# Patient Record
Sex: Male | Born: 1985 | Race: White | Hispanic: No | Marital: Single | Smoking: Heavy tobacco smoker
Health system: Southern US, Community
[De-identification: ages and names within clinical notes are randomized; demographics above are authoritative.]

## PROBLEM LIST (undated history)

## (undated) DIAGNOSIS — F17200 Nicotine dependence, unspecified, uncomplicated: Secondary | ICD-10-CM

## (undated) DIAGNOSIS — S72301A Unspecified fracture of shaft of right femur, initial encounter for closed fracture: Secondary | ICD-10-CM

## (undated) DIAGNOSIS — F319 Bipolar disorder, unspecified: Secondary | ICD-10-CM

## (undated) DIAGNOSIS — S46211A Strain of muscle, fascia and tendon of other parts of biceps, right arm, initial encounter: Secondary | ICD-10-CM

## (undated) DIAGNOSIS — F410 Panic disorder [episodic paroxysmal anxiety] without agoraphobia: Secondary | ICD-10-CM

## (undated) DIAGNOSIS — S82001B Unspecified fracture of right patella, initial encounter for open fracture type I or II: Secondary | ICD-10-CM

## (undated) HISTORY — PX: UPPER GASTROINTESTINAL ENDOSCOPY: SHX188

---

## 2015-06-17 ENCOUNTER — Emergency Department (HOSPITAL_COMMUNITY): Payer: 59

## 2015-06-17 ENCOUNTER — Emergency Department (HOSPITAL_COMMUNITY)
Admission: EM | Admit: 2015-06-17 | Discharge: 2015-06-18 | Disposition: A | Discharge status: Home / Self Care | Payer: 59 | Attending: Emergency Medicine | Admitting: Emergency Medicine

## 2015-06-17 ENCOUNTER — Encounter (HOSPITAL_COMMUNITY): Payer: Self-pay | Admitting: Emergency Medicine

## 2015-06-17 DIAGNOSIS — F17200 Nicotine dependence, unspecified, uncomplicated: Secondary | ICD-10-CM | POA: Insufficient documentation

## 2015-06-17 DIAGNOSIS — M542 Cervicalgia: Secondary | ICD-10-CM | POA: Insufficient documentation

## 2015-06-17 DIAGNOSIS — G43109 Migraine with aura, not intractable, without status migrainosus: Secondary | ICD-10-CM | POA: Insufficient documentation

## 2015-06-17 DIAGNOSIS — R51 Headache: Secondary | ICD-10-CM | POA: Diagnosis present

## 2015-06-17 DIAGNOSIS — R519 Headache, unspecified: Secondary | ICD-10-CM

## 2015-06-17 DIAGNOSIS — M545 Low back pain: Secondary | ICD-10-CM | POA: Diagnosis not present

## 2015-06-17 DIAGNOSIS — Z8659 Personal history of other mental and behavioral disorders: Secondary | ICD-10-CM | POA: Diagnosis not present

## 2015-06-17 HISTORY — DX: Bipolar disorder, unspecified: F31.9

## 2015-06-17 MED ORDER — METOCLOPRAMIDE HCL 5 MG/ML IJ SOLN
10.0000 mg | Freq: Once | INTRAMUSCULAR | Status: AC
  Administered 2015-06-18: 10 mg via INTRAVENOUS
  Filled 2015-06-17: qty 2

## 2015-06-17 MED ORDER — KETOROLAC TROMETHAMINE 15 MG/ML IJ SOLN
15.0000 mg | Freq: Once | INTRAMUSCULAR | Status: AC
  Administered 2015-06-18: 15 mg via INTRAVENOUS
  Filled 2015-06-17: qty 1

## 2015-06-17 MED ORDER — SODIUM CHLORIDE 0.9 % IV BOLUS (SEPSIS)
1000.0000 mL | Freq: Once | INTRAVENOUS | Status: AC
  Administered 2015-06-18: 1000 mL via INTRAVENOUS

## 2015-06-17 NOTE — ED Notes (Signed)
Pt c/o headache and back pain onset x 3 days, fever at home, no hx of migraines or chronic pain. Denies n/v/d

## 2015-06-17 NOTE — ED Provider Notes (Signed)
CSN: [id number]     Arrival date & time 06/17/15  2125 History  By signing my name below, I, Marisue Humble, attest that this documentation has been prepared under the direction and in the presence of Derwood Kaplan, MD . Electronically Signed: Marisue Humble, Scribe. 06/18/2015. 1:25 AM.   Chief Complaint  Patient presents with  . Headache   The history is provided by the patient. No language interpreter was used.   HPI Comments:  Joel Hansen is a 30 y.o. male with PMHx of bipolar 1 disorder who presents to the Emergency Department complaining of worsening 10/10 lower back pain, headache, and pain opening both eyes onset a few days ago, worsening today. Pain began as mild and has now progressed to the point he can't get up. All symptoms started at the same time; he has never experienced similar pain. Pt reports associated fatigue, neck discomfort, nausea, teary eyes, fever tmax 100 at home, chills onset in ED, and body aches. He has taken 7 Ibuprofen and Aleve without relief. Pt notes he does a lot of heavy lifting and traveling every day for work, but he denies h/o back injury. Pt reports sick contact with the flu. He reports his mother has h/o migraine headaches and his grandmother had a brain aneurism OR a brain tumor. Denies rhinorrhea, h/o of IV drug use, diarrhea, bloody stools, incontinence, new vision changes, numbness, tingling, rashes or tick bites.  Past Medical History  Diagnosis Date  . Bipolar 1 disorder (HCC)    History reviewed. No pertinent past surgical history. No family history on file. Social History  Substance Use Topics  . Smoking status: Current Every Day Smoker  . Smokeless tobacco: None  . Alcohol Use: No    Review of Systems  10 systems reviewed and all are negative for acute change except as noted in the HPI.  Allergies  Lexapro  Home Medications   Prior to Admission medications   Medication Sig Start Date End Date Taking? Authorizing Provider   naproxen (NAPROSYN) 500 MG tablet Take 1 tablet (500 mg total) by mouth 2 (two) times daily. 06/18/15   Chara Marquard, MD   BP 102/63 mmHg  Pulse 58  Temp(Src) 98.8 F (37.1 C) (Oral)  Resp 16  Ht  (1.854 m)  Wt 182 lb 6.4 oz (82.736 kg)  BMI 24.07 kg/m2  SpO2 97% Physical Exam  Constitutional: He is oriented to person, place, and time. He appears well-developed and well-nourished.  HENT:  Head: Normocephalic and atraumatic.  Eyes: EOM are normal.  No nystagmus seen; pupils 5mm equal reactive to light  Neck: Normal range of motion.  No meningismus  Cardiovascular: Normal rate, regular rhythm, normal heart sounds and intact distal pulses.   Pulmonary/Chest: Effort normal and breath sounds normal. No respiratory distress. He has no wheezes. He has no rales.  Lungs clear to auscultation   Abdominal: Soft. He exhibits no distension. There is no tenderness.  Musculoskeletal: Normal range of motion.  No midline L spine tenderness; no c-spine tenderness; pt has focal lower t-spine tenderness to palpation; no step offs, no ecchymoses; pt has left sided tenderness over the pelvic rim  Neurological: He is alert and oriented to person, place, and time. No cranial nerve deficit.  Cranial nerves 1-12 intact Upper extremity sensory exam normal and equal BL Cerebellar exam reveals no dysmetria  Upper and lower extremity strength 4+ and equal BL Trace patellar reflex BL, no hyperreflexia    Skin: Skin is warm and  dry.  Psychiatric: He has a normal mood and affect. Judgment normal.  Nursing note and vitals reviewed.   ED Course  Procedures  DIAGNOSTIC STUDIES:  Oxygen Saturation is 100% on RA, normal by my interpretation.    COORDINATION OF CARE:  11:24 PM Will administer fluids, Toradol, and Reglan. Will order imaging of thoracic spine, CBC, BMP, sedimentation rate and C-reactive protein. Discussed treatment plan with pt at bedside and pt agreed to plan.  1:05 AM Re-evaluated pt.  He notes medication has decreased his headache from a 10/10 to a 6/10. Photophobia and back pain are resolved. Still no focal neurologic complaints. Informed pt of imaging and lab results. Will administer another round of medication and re-evaluate. PT aware that the next step would be to get LP and r/o infection and bleed.  3:16 AM Headache and back pain are completely resolved. Pt advised to follow up with neurology since he has some migraine type features with his headache. Strict ER return precautions discussed, pt will come back to ER if there is any confusion, fevers, seizures, neck stiffness, or any other focal neurologic complaints.   Labs Review Labs Reviewed  CBC WITH DIFFERENTIAL/PLATELET - Abnormal; Notable for the following:    WBC 13.8 (*)    Neutro Abs 10.6 (*)    All other components within normal limits  BASIC METABOLIC PANEL  SEDIMENTATION RATE  C-REACTIVE PROTEIN    Imaging Review Dg Thoracic Spine 2 View  06/18/2015  CLINICAL DATA:  Mid and lower back pain since this morning. No known injury. EXAM: THORACIC SPINE 2 VIEWS COMPARISON:  None. FINDINGS: There is no evidence of thoracic spine fracture. Alignment is normal. No other significant bone abnormalities are identified. IMPRESSION: Negative. Electronically Signed   By: Marnee SpringJonathon  Watts M.D.   On: 06/18/2015 00:11   Dg Lumbar Spine Complete  06/18/2015  CLINICAL DATA:  Initial evaluation for acute severe mid and lower back pain. No injury. EXAM: LUMBAR SPINE - COMPLETE 4+ VIEW COMPARISON:  None. FINDINGS: Five non rib-bearing lumbar type vertebral bodies present. Vertebral bodies normally aligned with preservation of the normal lumbar lordosis paravertebral body heights well preserved. No acute or chronic fracture. Dermal degenerative endplate spurring anteriorly at the superior endplates of L3 no No other significant degenerative changes. SI joints approximated and symmetric. No soft tissue abnormality. IMPRESSION: 1. No  acute abnormality identified. 2. Minimal degenerative endplate spurring at the superior endplates of L3 and L4. No other significant degenerative changes identified. Electronically Signed   By: Rise MuBenjamin  McClintock M.D.   On: 06/18/2015 00:34   I have personally reviewed and evaluated these images and lab results as part of my medical decision-making.   EKG Interpretation None      MDM   Final diagnoses:  Acute nonintractable headache, unspecified headache type  Migraine equivalent    I personally performed the services described in this documentation, which was scribed in my presence. The recorded information has been reviewed and is accurate.  Pt comes in with cc of headache and back pain. Headache has some migraine like features - nausea, photophobia, throbbing pain. However, there is no hx headache for the patient. He does report that he has hx of brain tumor vs AN in the family and also migraine.  Clinical exam reveals no signs of elevated ICP as patient is laying flat and has no visual complains. He also doesn't have any meningismus or fevers. Pt has no focal neurologic deficit. Eye exam is normal - and pt  denies any new vision loss (baseline vision problems only).  Appears more like migraine type pain. We will start meds, and labs. We will get LP if patient is not improved or developed a fever.  Pt also has point tenderness on the spine - lower T spine mostly. With that, pt denies numbness, weakness, urinary incontinence, urinary retention, bowel incontinence, weakness, saddle anesthesia. We are not concerned for spinal cord compression. We will still get sed rate to screen for any deep infection and get Xrays.      Derwood Kaplan, MD 06/18/15 [phone]

## 2015-06-18 LAB — CBC WITH DIFFERENTIAL/PLATELET
Basophils Absolute: 0.1 10*3/uL (ref 0.0–0.1)
Basophils Relative: 0 %
EOS ABS: 0.1 10*3/uL (ref 0.0–0.7)
EOS PCT: 1 %
HCT: 40.4 % (ref 39.0–52.0)
HEMOGLOBIN: 13.9 g/dL (ref 13.0–17.0)
LYMPHS ABS: 2.2 10*3/uL (ref 0.7–4.0)
LYMPHS PCT: 16 %
MCH: 30.2 pg (ref 26.0–34.0)
MCHC: 34.4 g/dL (ref 30.0–36.0)
MCV: 87.8 fL (ref 78.0–100.0)
MONOS PCT: 6 %
Monocytes Absolute: 0.8 10*3/uL (ref 0.1–1.0)
Neutro Abs: 10.6 10*3/uL — ABNORMAL HIGH (ref 1.7–7.7)
Neutrophils Relative %: 77 %
PLATELETS: 268 10*3/uL (ref 150–400)
RBC: 4.6 MIL/uL (ref 4.22–5.81)
RDW: 13.1 % (ref 11.5–15.5)
WBC: 13.8 10*3/uL — ABNORMAL HIGH (ref 4.0–10.5)

## 2015-06-18 LAB — BASIC METABOLIC PANEL
Anion gap: 7 (ref 5–15)
BUN: 12 mg/dL (ref 6–20)
CO2: 25 mmol/L (ref 22–32)
CREATININE: 1.1 mg/dL (ref 0.61–1.24)
Calcium: 9.1 mg/dL (ref 8.9–10.3)
Chloride: 105 mmol/L (ref 101–111)
GFR calc Af Amer: >60 mL/min (ref >60)
GFR calc non Af Amer: >60 mL/min (ref >60)
GLUCOSE: 98 mg/dL (ref 65–99)
Potassium: 3.8 mmol/L (ref 3.5–5.1)
SODIUM: 137 mmol/L (ref 135–145)

## 2015-06-18 LAB — C-REACTIVE PROTEIN: CRP: 0.5 mg/dL (ref <1.0)

## 2015-06-18 MED ORDER — KETOROLAC TROMETHAMINE 15 MG/ML IJ SOLN
15.0000 mg | Freq: Once | INTRAMUSCULAR | Status: AC
  Administered 2015-06-18: 15 mg via INTRAVENOUS
  Filled 2015-06-18: qty 1

## 2015-06-18 MED ORDER — DIPHENHYDRAMINE HCL 50 MG/ML IJ SOLN
25.0000 mg | Freq: Once | INTRAMUSCULAR | Status: AC
  Administered 2015-06-18: 25 mg via INTRAVENOUS
  Filled 2015-06-18: qty 1

## 2015-06-18 MED ORDER — NAPROXEN 500 MG PO TABS: 500.0000 mg | ORAL_TABLET | Freq: Two times a day (BID) | ORAL | Status: DC

## 2015-06-18 MED ORDER — METOCLOPRAMIDE HCL 5 MG/ML IJ SOLN
10.0000 mg | Freq: Once | INTRAMUSCULAR | Status: AC
  Administered 2015-06-18: 10 mg via INTRAVENOUS
  Filled 2015-06-18: qty 2

## 2015-06-18 MED ORDER — DEXAMETHASONE SODIUM PHOSPHATE 10 MG/ML IJ SOLN
10.0000 mg | Freq: Once | INTRAMUSCULAR | Status: AC
  Administered 2015-06-18: 10 mg via INTRAVENOUS
  Filled 2015-06-18: qty 1

## 2015-06-18 NOTE — ED Notes (Signed)
MD aware [facility]. Lab states a courier is in route to pick up the blood to transport to [facility].

## 2015-06-18 NOTE — Discharge Instructions (Signed)
We saw you in the ER for headaches. All the labs and imaging are normal. We are not sure what is causing your headaches, however, there appears to be no evidence of infection, bleeds or tumors based on our exam and results. Please see the Neurologist as requested.  Please take naprosyn as prescribed. See your doctor if the pain persists, as you might need better medications or a specialist.  Please return to the ER if the headache gets severe and in not improving, you have associated new one sided numbness, tingling, weakness or confusion, seizures, poor balance or poor vision.   General Headache Without Cause A headache is pain or discomfort felt around the head or neck area. The specific cause of a headache may not be found. There are many causes and types of headaches. A few common ones are:  Tension headaches.  Migraine headaches.  Cluster headaches.  Chronic daily headaches. HOME CARE INSTRUCTIONS  Watch your condition for any changes. Take these steps to help with your condition: Managing Pain  Take over-the-counter and prescription medicines only as told by your health care provider.  Lie down in a dark, quiet room when you have a headache.  If directed, apply ice to the head and neck area:  Put ice in a plastic bag.  Place a towel between your skin and the bag.  Leave the ice on for 20 minutes, 2-3 times per day.  Use a heating pad or hot shower to apply heat to the head and neck area as told by your health care provider.  Keep lights dim if bright lights bother you or make your headaches worse. Eating and Drinking  Eat meals on a regular schedule.  Limit alcohol use.  Decrease the amount of caffeine you drink, or stop drinking caffeine. General Instructions  Keep all follow-up visits as told by your health care provider. This is important.  Keep a headache journal to help find out what may trigger your headaches. For example, write down:  What you eat and  drink.  How much sleep you get.  Any change to your diet or medicines.  Try massage or other relaxation techniques.  Limit stress.  Sit up straight, and do not tense your muscles.  Do not use tobacco products, including cigarettes, chewing tobacco, or e-cigarettes. If you need help quitting, ask your health care provider.  Exercise regularly as told by your health care provider.  Sleep on a regular schedule. Get 7-9 hours of sleep, or the amount recommended by your health care provider. SEEK MEDICAL CARE IF:   Your symptoms are not helped by medicine.  You have a headache that is different from the usual headache.  You have nausea or you vomit.  You have a fever. SEEK IMMEDIATE MEDICAL CARE IF:   Your headache becomes severe.  You have repeated vomiting.  You have a stiff neck.  You have a loss of vision.  You have problems with speech.  You have pain in the eye or ear.  You have muscular weakness or loss of muscle control.  You lose your balance or have trouble walking.  You feel faint or pass out.  You have confusion.   This information is not intended to replace advice given to you by your health care provider. Make sure you discuss any questions you have with your health care provider.   Document Released: 02/10/2005 Document Revised: 11/01/2014 Document Reviewed: 06/05/2014 Elsevier Interactive Patient Education [employer]  Migraine Headache A migraine headache is an intense, throbbing pain on one or both sides of your head. A migraine can last for 30 minutes to several hours. CAUSES  The exact cause of a migraine headache is not always known. However, a migraine may be caused when nerves in the brain become irritated and release chemicals that cause inflammation. This causes pain. Certain things may also trigger migraines, such as:  Alcohol.  Smoking.  Stress.  Menstruation.  Aged cheeses.  Foods or drinks that contain  nitrates, glutamate, aspartame, or tyramine.  Lack of sleep.  Chocolate.  Caffeine.  Hunger.  Physical exertion.  Fatigue.  Medicines used to treat chest pain (nitroglycerine), birth control pills, estrogen, and some blood pressure medicines. SIGNS AND SYMPTOMS  Pain on one or both sides of your head.  Pulsating or throbbing pain.  Severe pain that prevents daily activities.  Pain that is aggravated by any physical activity.  Nausea, vomiting, or both.  Dizziness.  Pain with exposure to bright lights, loud noises, or activity.  General sensitivity to bright lights, loud noises, or smells. Before you get a migraine, you may get warning signs that a migraine is coming (aura). An aura may include:  Seeing flashing lights.  Seeing bright spots, halos, or zigzag lines.  Having tunnel vision or blurred vision.  Having feelings of numbness or tingling.  Having trouble talking.  Having muscle weakness. DIAGNOSIS  A migraine headache is often diagnosed based on:  Symptoms.  Physical exam.  A CT scan or MRI of your head. These imaging tests cannot diagnose migraines, but they can help rule out other causes of headaches. TREATMENT Medicines may be given for pain and nausea. Medicines can also be given to help prevent recurrent migraines.  HOME CARE INSTRUCTIONS  Only take over-the-counter or prescription medicines for pain or discomfort as directed by your health care provider. The use of long-term narcotics is not recommended.  Lie down in a dark, quiet room when you have a migraine.  Keep a journal to find out what may trigger your migraine headaches. For example, write down:  What you eat and drink.  How much sleep you get.  Any change to your diet or medicines.  Limit alcohol consumption.  Quit smoking if you smoke.  Get 7-9 hours of sleep, or as recommended by your health care provider.  Limit stress.  Keep lights dim if bright lights bother you  and make your migraines worse. SEEK IMMEDIATE MEDICAL CARE IF:   Your migraine becomes severe.  You have a fever.  You have a stiff neck.  You have vision loss.  You have muscular weakness or loss of muscle control.  You start losing your balance or have trouble walking.  You feel faint or pass out.  You have severe symptoms that are different from your first symptoms. MAKE SURE YOU:   Understand these instructions.  Will watch your condition.  Will get help right away if you are not doing well or get worse.   This information is not intended to replace advice given to you by your health care provider. Make sure you discuss any questions you have with your health care provider.   Document Released: 02/10/2005 Document Revised: 03/03/2014 Document Reviewed: 10/18/2012 Elsevier Interactive Patient Education [employer]

## 2015-06-21 LAB — SEDIMENTATION RATE: Sed Rate: 2 mm/h (ref 0–16)

## 2017-02-03 ENCOUNTER — Ambulatory Visit (HOSPITAL_COMMUNITY): Admission: RE | Admit: 2017-02-03 | Payer: 59 | Source: Ambulatory Visit

## 2017-03-06 ENCOUNTER — Ambulatory Visit (HOSPITAL_COMMUNITY)
Admission: RE | Admit: 2017-03-06 | Discharge: 2017-03-06 | Disposition: A | Discharge status: Home / Self Care | Payer: Self-pay | Source: Ambulatory Visit | Attending: Maternal & Fetal Medicine | Admitting: Maternal & Fetal Medicine

## 2017-03-06 DIAGNOSIS — Z31448 Encounter for other genetic testing of male for procreative management: Secondary | ICD-10-CM | POA: Insufficient documentation

## 2017-03-06 NOTE — ED Notes (Signed)
[facility]  Sign other [name], Kristen MRN:  [id number].

## 2017-03-17 ENCOUNTER — Telehealth (HOSPITAL_COMMUNITY): Payer: Self-pay | Admitting: *Deleted

## 2017-05-06 ENCOUNTER — Inpatient Hospital Stay (HOSPITAL_COMMUNITY)
Admission: EM | Admit: 2017-05-06 | Discharge: 2017-05-10 | DRG: 481 | Disposition: A | Discharge status: Home / Self Care | Payer: Worker's Compensation | Attending: Orthopedic Surgery | Admitting: Orthopedic Surgery

## 2017-05-06 DIAGNOSIS — S72351B Displaced comminuted fracture of shaft of right femur, initial encounter for open fracture type I or II: Principal | ICD-10-CM | POA: Diagnosis present

## 2017-05-06 DIAGNOSIS — E8889 Other specified metabolic disorders: Secondary | ICD-10-CM | POA: Diagnosis present

## 2017-05-06 DIAGNOSIS — R062 Wheezing: Secondary | ICD-10-CM | POA: Diagnosis present

## 2017-05-06 DIAGNOSIS — D62 Acute posthemorrhagic anemia: Secondary | ICD-10-CM | POA: Diagnosis not present

## 2017-05-06 DIAGNOSIS — F411 Generalized anxiety disorder: Secondary | ICD-10-CM | POA: Diagnosis present

## 2017-05-06 DIAGNOSIS — S72401B Unspecified fracture of lower end of right femur, initial encounter for open fracture type I or II: Secondary | ICD-10-CM

## 2017-05-06 DIAGNOSIS — T148XXA Other injury of unspecified body region, initial encounter: Secondary | ICD-10-CM

## 2017-05-06 DIAGNOSIS — S82001B Unspecified fracture of right patella, initial encounter for open fracture type I or II: Secondary | ICD-10-CM | POA: Diagnosis present

## 2017-05-06 DIAGNOSIS — Y92411 Interstate highway as the place of occurrence of the external cause: Secondary | ICD-10-CM

## 2017-05-06 DIAGNOSIS — S0181XA Laceration without foreign body of other part of head, initial encounter: Secondary | ICD-10-CM

## 2017-05-06 DIAGNOSIS — Z419 Encounter for procedure for purposes other than remedying health state, unspecified: Secondary | ICD-10-CM

## 2017-05-06 DIAGNOSIS — S46211A Strain of muscle, fascia and tendon of other parts of biceps, right arm, initial encounter: Secondary | ICD-10-CM | POA: Diagnosis present

## 2017-05-06 DIAGNOSIS — S01112A Laceration without foreign body of left eyelid and periocular area, initial encounter: Secondary | ICD-10-CM | POA: Diagnosis present

## 2017-05-06 DIAGNOSIS — F17200 Nicotine dependence, unspecified, uncomplicated: Secondary | ICD-10-CM | POA: Diagnosis present

## 2017-05-06 DIAGNOSIS — M25551 Pain in right hip: Secondary | ICD-10-CM | POA: Diagnosis not present

## 2017-05-06 DIAGNOSIS — F17210 Nicotine dependence, cigarettes, uncomplicated: Secondary | ICD-10-CM | POA: Diagnosis present

## 2017-05-06 DIAGNOSIS — S060X0A Concussion without loss of consciousness, initial encounter: Secondary | ICD-10-CM | POA: Diagnosis present

## 2017-05-06 DIAGNOSIS — S72301A Unspecified fracture of shaft of right femur, initial encounter for closed fracture: Secondary | ICD-10-CM | POA: Diagnosis present

## 2017-05-06 DIAGNOSIS — T1490XA Injury, unspecified, initial encounter: Secondary | ICD-10-CM

## 2017-05-06 DIAGNOSIS — S82091B Other fracture of right patella, initial encounter for open fracture type I or II: Secondary | ICD-10-CM

## 2017-05-06 DIAGNOSIS — Y990 Civilian activity done for income or pay: Secondary | ICD-10-CM

## 2017-05-06 HISTORY — DX: Strain of muscle, fascia and tendon of other parts of biceps, right arm, initial encounter: S46.211A

## 2017-05-06 HISTORY — DX: Unspecified fracture of shaft of right femur, initial encounter for closed fracture: S72.301A

## 2017-05-06 HISTORY — DX: Unspecified fracture of right patella, initial encounter for open fracture type I or II: S82.001B

## 2017-05-06 HISTORY — DX: Nicotine dependence, unspecified, uncomplicated: F17.200

## 2017-05-06 HISTORY — DX: Panic disorder (episodic paroxysmal anxiety): F41.0

## 2017-05-07 ENCOUNTER — Emergency Department (HOSPITAL_COMMUNITY): Payer: Worker's Compensation

## 2017-05-07 ENCOUNTER — Inpatient Hospital Stay (HOSPITAL_COMMUNITY): Payer: Worker's Compensation | Admitting: Anesthesiology

## 2017-05-07 ENCOUNTER — Inpatient Hospital Stay (HOSPITAL_COMMUNITY): Payer: Worker's Compensation

## 2017-05-07 ENCOUNTER — Encounter (HOSPITAL_COMMUNITY): Payer: Self-pay | Admitting: Emergency Medicine

## 2017-05-07 ENCOUNTER — Encounter (HOSPITAL_COMMUNITY)
Admission: EM | Disposition: A | Discharge status: Home / Self Care | Payer: Self-pay | Source: Home / Self Care | Attending: Orthopedic Surgery

## 2017-05-07 ENCOUNTER — Other Ambulatory Visit: Payer: Self-pay

## 2017-05-07 DIAGNOSIS — F17200 Nicotine dependence, unspecified, uncomplicated: Secondary | ICD-10-CM

## 2017-05-07 DIAGNOSIS — D62 Acute posthemorrhagic anemia: Secondary | ICD-10-CM | POA: Diagnosis not present

## 2017-05-07 DIAGNOSIS — Y990 Civilian activity done for income or pay: Secondary | ICD-10-CM | POA: Diagnosis not present

## 2017-05-07 DIAGNOSIS — Y92411 Interstate highway as the place of occurrence of the external cause: Secondary | ICD-10-CM | POA: Diagnosis not present

## 2017-05-07 DIAGNOSIS — S72351B Displaced comminuted fracture of shaft of right femur, initial encounter for open fracture type I or II: Secondary | ICD-10-CM | POA: Diagnosis not present

## 2017-05-07 DIAGNOSIS — F17210 Nicotine dependence, cigarettes, uncomplicated: Secondary | ICD-10-CM | POA: Diagnosis not present

## 2017-05-07 DIAGNOSIS — S01112A Laceration without foreign body of left eyelid and periocular area, initial encounter: Secondary | ICD-10-CM | POA: Diagnosis present

## 2017-05-07 DIAGNOSIS — S46211A Strain of muscle, fascia and tendon of other parts of biceps, right arm, initial encounter: Secondary | ICD-10-CM | POA: Diagnosis present

## 2017-05-07 DIAGNOSIS — S72301A Unspecified fracture of shaft of right femur, initial encounter for closed fracture: Secondary | ICD-10-CM

## 2017-05-07 DIAGNOSIS — E8889 Other specified metabolic disorders: Secondary | ICD-10-CM | POA: Diagnosis present

## 2017-05-07 DIAGNOSIS — S060X0A Concussion without loss of consciousness, initial encounter: Secondary | ICD-10-CM | POA: Diagnosis present

## 2017-05-07 DIAGNOSIS — R062 Wheezing: Secondary | ICD-10-CM | POA: Diagnosis present

## 2017-05-07 DIAGNOSIS — F411 Generalized anxiety disorder: Secondary | ICD-10-CM | POA: Diagnosis present

## 2017-05-07 DIAGNOSIS — S82001B Unspecified fracture of right patella, initial encounter for open fracture type I or II: Secondary | ICD-10-CM | POA: Diagnosis not present

## 2017-05-07 DIAGNOSIS — M25551 Pain in right hip: Secondary | ICD-10-CM | POA: Diagnosis present

## 2017-05-07 HISTORY — PX: FEMUR IM NAIL: SHX1597

## 2017-05-07 HISTORY — DX: Unspecified fracture of shaft of right femur, initial encounter for closed fracture: S72.301A

## 2017-05-07 HISTORY — PX: I & D EXTREMITY: SHX5045

## 2017-05-07 HISTORY — DX: Nicotine dependence, unspecified, uncomplicated: F17.200

## 2017-05-07 HISTORY — PX: ORIF PATELLA: SHX5033

## 2017-05-07 LAB — COMPREHENSIVE METABOLIC PANEL
ALK PHOS: 73 U/L (ref 38–126)
ALK PHOS: 85 U/L (ref 38–126)
ALT: 23 U/L (ref 17–63)
ALT: 27 U/L (ref 17–63)
ANION GAP: 13 (ref 5–15)
AST: 32 U/L (ref 15–41)
AST: 34 U/L (ref 15–41)
Albumin: 3.8 g/dL (ref 3.5–5.0)
Albumin: 4.1 g/dL (ref 3.5–5.0)
Anion gap: 9 (ref 5–15)
BILIRUBIN TOTAL: 1 mg/dL (ref 0.3–1.2)
BUN: 11 mg/dL (ref 6–20)
BUN: 12 mg/dL (ref 6–20)
CALCIUM: 8.7 mg/dL — AB (ref 8.9–10.3)
CALCIUM: 9.3 mg/dL (ref 8.9–10.3)
CO2: 20 mmol/L — AB (ref 22–32)
CO2: 21 mmol/L — AB (ref 22–32)
CREATININE: 1.03 mg/dL (ref 0.61–1.24)
Chloride: 107 mmol/L (ref 101–111)
Chloride: 108 mmol/L (ref 101–111)
Creatinine, Ser: 0.94 mg/dL (ref 0.61–1.24)
GFR calc Af Amer: >60 mL/min (ref >60)
GFR calc non Af Amer: >60 mL/min (ref >60)
GLUCOSE: 185 mg/dL — AB (ref 65–99)
Glucose, Bld: 124 mg/dL — ABNORMAL HIGH (ref 65–99)
Potassium: 3.1 mmol/L — ABNORMAL LOW (ref 3.5–5.1)
Potassium: 4 mmol/L (ref 3.5–5.1)
SODIUM: 140 mmol/L (ref 135–145)
Sodium: 138 mmol/L (ref 135–145)
TOTAL PROTEIN: 6.8 g/dL (ref 6.5–8.1)
Total Bilirubin: 1.2 mg/dL (ref 0.3–1.2)
Total Protein: 6.1 g/dL — ABNORMAL LOW (ref 6.5–8.1)

## 2017-05-07 LAB — DIFFERENTIAL
BASOS PCT: 0 %
Basophils Absolute: 0 10*3/uL (ref 0.0–0.1)
EOS ABS: 0.2 10*3/uL (ref 0.0–0.7)
EOS PCT: 1 %
LYMPHS PCT: 37 %
Lymphs Abs: 5.7 10*3/uL — ABNORMAL HIGH (ref 0.7–4.0)
MONOS PCT: 7 %
Monocytes Absolute: 1.1 10*3/uL — ABNORMAL HIGH (ref 0.1–1.0)
NEUTROS ABS: 8.4 10*3/uL — AB (ref 1.7–7.7)
Neutrophils Relative %: 55 %

## 2017-05-07 LAB — LACTIC ACID, PLASMA: Lactic Acid, Venous: 0.7 mmol/L (ref 0.5–1.9)

## 2017-05-07 LAB — PROTIME-INR
INR: 1.02
Prothrombin Time: 13.3 seconds (ref 11.4–15.2)

## 2017-05-07 LAB — HIV ANTIBODY (ROUTINE TESTING W REFLEX): HIV SCREEN 4TH GENERATION: NONREACTIVE

## 2017-05-07 LAB — CBC
HCT: 40.7 % (ref 39.0–52.0)
HEMOGLOBIN: 13.5 g/dL (ref 13.0–17.0)
MCH: 30.2 pg (ref 26.0–34.0)
MCHC: 33.2 g/dL (ref 30.0–36.0)
MCV: 91.1 fL (ref 78.0–100.0)
PLATELETS: 200 10*3/uL (ref 150–400)
RBC: 4.47 MIL/uL (ref 4.22–5.81)
RDW: 13.3 % (ref 11.5–15.5)
WBC: 15.4 10*3/uL — ABNORMAL HIGH (ref 4.0–10.5)

## 2017-05-07 LAB — ETHANOL

## 2017-05-07 LAB — SURGICAL PCR SCREEN
MRSA, PCR: NEGATIVE
Staphylococcus aureus: NEGATIVE

## 2017-05-07 LAB — CDS SEROLOGY

## 2017-05-07 LAB — HEMOGLOBIN A1C
Hgb A1c MFr Bld: 4.7 % — ABNORMAL LOW (ref 4.8–5.6)
Mean Plasma Glucose: 88.19 mg/dL

## 2017-05-07 LAB — SAMPLE TO BLOOD BANK

## 2017-05-07 SURGERY — INSERTION, INTRAMEDULLARY ROD, FEMUR, RETROGRADE
Anesthesia: General | Laterality: Right

## 2017-05-07 MED ORDER — SUGAMMADEX SODIUM 500 MG/5ML IV SOLN
INTRAVENOUS | Status: AC
  Filled 2017-05-07: qty 5

## 2017-05-07 MED ORDER — PROMETHAZINE HCL 25 MG/ML IJ SOLN: 6.2500 mg | INTRAMUSCULAR | Status: DC | PRN

## 2017-05-07 MED ORDER — CEFAZOLIN SODIUM-DEXTROSE 2-4 GM/100ML-% IV SOLN
2.0000 g | Freq: Once | INTRAVENOUS | Status: AC
  Administered 2017-05-07: 2 g via INTRAVENOUS

## 2017-05-07 MED ORDER — PROPOFOL 10 MG/ML IV BOLUS
INTRAVENOUS | Status: DC | PRN
  Administered 2017-05-07: 200 mg via INTRAVENOUS

## 2017-05-07 MED ORDER — IOPAMIDOL (ISOVUE-300) INJECTION 61%
INTRAVENOUS | Status: AC
  Administered 2017-05-07: 100 mL
  Filled 2017-05-07: qty 100

## 2017-05-07 MED ORDER — MUPIROCIN 2 % EX OINT: 1.0000 "application " | TOPICAL_OINTMENT | Freq: Two times a day (BID) | CUTANEOUS | Status: DC

## 2017-05-07 MED ORDER — DOCUSATE SODIUM 100 MG PO CAPS
100.0000 mg | ORAL_CAPSULE | Freq: Two times a day (BID) | ORAL | Status: DC
  Administered 2017-05-07 – 2017-05-10 (×6): 100 mg via ORAL
  Filled 2017-05-07 (×6): qty 1

## 2017-05-07 MED ORDER — HYDROMORPHONE HCL 1 MG/ML IJ SOLN
INTRAMUSCULAR | Status: AC
  Administered 2017-05-07: 1 mg
  Filled 2017-05-07: qty 1

## 2017-05-07 MED ORDER — LACTATED RINGERS IV SOLN
INTRAVENOUS | Status: DC
  Administered 2017-05-07 (×2): via INTRAVENOUS

## 2017-05-07 MED ORDER — DEXMEDETOMIDINE HCL 200 MCG/2ML IV SOLN
INTRAVENOUS | Status: DC | PRN
  Administered 2017-05-07: 12 ug via INTRAVENOUS
  Administered 2017-05-07: 10 ug via INTRAVENOUS
  Administered 2017-05-07 (×2): 8 ug via INTRAVENOUS

## 2017-05-07 MED ORDER — METHOCARBAMOL 500 MG PO TABS: 1000.0000 mg | ORAL_TABLET | Freq: Three times a day (TID) | ORAL | Status: DC

## 2017-05-07 MED ORDER — LIDOCAINE HCL (CARDIAC) 20 MG/ML IV SOLN
INTRAVENOUS | Status: DC | PRN
  Administered 2017-05-07: 100 mg via INTRAVENOUS

## 2017-05-07 MED ORDER — ACETAMINOPHEN 10 MG/ML IV SOLN: 1000.0000 mg | Freq: Four times a day (QID) | INTRAVENOUS | Status: DC

## 2017-05-07 MED ORDER — ROCURONIUM BROMIDE 100 MG/10ML IV SOLN
INTRAVENOUS | Status: DC | PRN
  Administered 2017-05-07: 50 mg via INTRAVENOUS
  Administered 2017-05-07: 20 mg via INTRAVENOUS
  Administered 2017-05-07: 50 mg via INTRAVENOUS

## 2017-05-07 MED ORDER — METHOCARBAMOL 500 MG PO TABS: 500.0000 mg | ORAL_TABLET | Freq: Three times a day (TID) | ORAL | Status: DC

## 2017-05-07 MED ORDER — ONDANSETRON HCL 4 MG PO TABS: 4.0000 mg | ORAL_TABLET | Freq: Four times a day (QID) | ORAL | Status: DC | PRN

## 2017-05-07 MED ORDER — SUGAMMADEX SODIUM 500 MG/5ML IV SOLN
INTRAVENOUS | Status: DC | PRN
  Administered 2017-05-07: 300 mg via INTRAVENOUS

## 2017-05-07 MED ORDER — METHOCARBAMOL 1000 MG/10ML IJ SOLN: 500.0000 mg | Freq: Four times a day (QID) | INTRAVENOUS | Status: DC | PRN

## 2017-05-07 MED ORDER — POTASSIUM CHLORIDE IN NACL 20-0.9 MEQ/L-% IV SOLN
INTRAVENOUS | Status: DC
  Administered 2017-05-07: 17:00:00 via INTRAVENOUS
  Administered 2017-05-08: 10 mL/h via INTRAVENOUS
  Filled 2017-05-07 (×2): qty 1000

## 2017-05-07 MED ORDER — ENOXAPARIN SODIUM 30 MG/0.3ML ~~LOC~~ SOLN: 30.0000 mg | SUBCUTANEOUS | Status: DC

## 2017-05-07 MED ORDER — SODIUM CHLORIDE 0.9 % IV SOLN
INTRAVENOUS | Status: AC | PRN
  Administered 2017-05-07: 1000 mL via INTRAVENOUS

## 2017-05-07 MED ORDER — ACETAMINOPHEN 10 MG/ML IV SOLN
1000.0000 mg | Freq: Four times a day (QID) | INTRAVENOUS | Status: DC
  Administered 2017-05-07 – 2017-05-08 (×3): 1000 mg via INTRAVENOUS
  Filled 2017-05-07 (×3): qty 100

## 2017-05-07 MED ORDER — ACETAMINOPHEN 650 MG RE SUPP: 650.0000 mg | Freq: Four times a day (QID) | RECTAL | Status: DC | PRN

## 2017-05-07 MED ORDER — MIDAZOLAM HCL 5 MG/5ML IJ SOLN
INTRAMUSCULAR | Status: DC | PRN
  Administered 2017-05-07: 2 mg via INTRAVENOUS

## 2017-05-07 MED ORDER — 0.9 % SODIUM CHLORIDE (POUR BTL) OPTIME
TOPICAL | Status: DC | PRN
  Administered 2017-05-07: 1000 mL

## 2017-05-07 MED ORDER — METHOCARBAMOL 500 MG PO TABS: 500.0000 mg | ORAL_TABLET | Freq: Four times a day (QID) | ORAL | Status: DC | PRN

## 2017-05-07 MED ORDER — ONDANSETRON HCL 4 MG/2ML IJ SOLN
4.0000 mg | Freq: Once | INTRAMUSCULAR | Status: AC
  Administered 2017-05-07: 4 mg via INTRAVENOUS
  Filled 2017-05-07: qty 2

## 2017-05-07 MED ORDER — CEFAZOLIN SODIUM-DEXTROSE 1-4 GM/50ML-% IV SOLN
1.0000 g | Freq: Four times a day (QID) | INTRAVENOUS | Status: AC
  Administered 2017-05-07 – 2017-05-08 (×3): 1 g via INTRAVENOUS
  Filled 2017-05-07 (×3): qty 50

## 2017-05-07 MED ORDER — OXYCODONE HCL 5 MG PO TABS: 5.0000 mg | ORAL_TABLET | ORAL | Status: DC | PRN

## 2017-05-07 MED ORDER — FENTANYL CITRATE (PF) 250 MCG/5ML IJ SOLN
INTRAMUSCULAR | Status: AC
  Filled 2017-05-07: qty 5

## 2017-05-07 MED ORDER — PROPOFOL 10 MG/ML IV BOLUS
INTRAVENOUS | Status: AC
  Filled 2017-05-07: qty 20

## 2017-05-07 MED ORDER — DEXAMETHASONE SODIUM PHOSPHATE 10 MG/ML IJ SOLN
INTRAMUSCULAR | Status: DC | PRN
  Administered 2017-05-07: 8 mg via INTRAVENOUS

## 2017-05-07 MED ORDER — SODIUM CHLORIDE 0.9 % IR SOLN
Status: DC | PRN
  Administered 2017-05-07 (×2): 3000 mL

## 2017-05-07 MED ORDER — MAGNESIUM CITRATE PO SOLN: 1.0000 | Freq: Once | ORAL | Status: DC | PRN

## 2017-05-07 MED ORDER — ACETAMINOPHEN 500 MG PO TABS: 1000.0000 mg | ORAL_TABLET | Freq: Four times a day (QID) | ORAL | Status: DC

## 2017-05-07 MED ORDER — FENTANYL CITRATE (PF) 100 MCG/2ML IJ SOLN
INTRAMUSCULAR | Status: DC | PRN
  Administered 2017-05-07: 150 ug via INTRAVENOUS
  Administered 2017-05-07: 100 ug via INTRAVENOUS

## 2017-05-07 MED ORDER — CEFAZOLIN SODIUM-DEXTROSE 2-4 GM/100ML-% IV SOLN
2.0000 g | INTRAVENOUS | Status: AC
  Administered 2017-05-07: 2 g via INTRAVENOUS
  Filled 2017-05-07 (×2): qty 100

## 2017-05-07 MED ORDER — BISACODYL 5 MG PO TBEC: 5.0000 mg | DELAYED_RELEASE_TABLET | Freq: Every day | ORAL | Status: DC | PRN

## 2017-05-07 MED ORDER — ALBUTEROL SULFATE (2.5 MG/3ML) 0.083% IN NEBU
2.5000 mg | INHALATION_SOLUTION | RESPIRATORY_TRACT | Status: AC
  Administered 2017-05-07: 2.5 mg via RESPIRATORY_TRACT
  Filled 2017-05-07: qty 3

## 2017-05-07 MED ORDER — ACETAMINOPHEN 325 MG PO TABS: 650.0000 mg | ORAL_TABLET | Freq: Four times a day (QID) | ORAL | Status: DC | PRN

## 2017-05-07 MED ORDER — ACETAMINOPHEN 10 MG/ML IV SOLN
INTRAVENOUS | Status: AC
  Filled 2017-05-07: qty 100

## 2017-05-07 MED ORDER — GABAPENTIN 300 MG PO CAPS
600.0000 mg | ORAL_CAPSULE | Freq: Once | ORAL | Status: AC
  Administered 2017-05-07: 600 mg via ORAL
  Filled 2017-05-07: qty 2

## 2017-05-07 MED ORDER — HYDROMORPHONE HCL 1 MG/ML IJ SOLN
1.0000 mg | Freq: Once | INTRAMUSCULAR | Status: AC
  Administered 2017-05-07: 1 mg via INTRAVENOUS
  Filled 2017-05-07: qty 1

## 2017-05-07 MED ORDER — HYDROMORPHONE HCL 1 MG/ML IJ SOLN
1.0000 mg | INTRAMUSCULAR | Status: DC | PRN
  Administered 2017-05-07 (×3): 1 mg via INTRAVENOUS
  Filled 2017-05-07 (×3): qty 1

## 2017-05-07 MED ORDER — POLYETHYLENE GLYCOL 3350 17 G PO PACK
17.0000 g | PACK | Freq: Every day | ORAL | Status: DC
  Administered 2017-05-07 – 2017-05-10 (×4): 17 g via ORAL
  Filled 2017-05-07 (×4): qty 1

## 2017-05-07 MED ORDER — GLYCOPYRROLATE 0.2 MG/ML IJ SOLN
INTRAMUSCULAR | Status: DC | PRN
  Administered 2017-05-07: 0.3 mg via INTRAVENOUS

## 2017-05-07 MED ORDER — GABAPENTIN 300 MG PO CAPS
300.0000 mg | ORAL_CAPSULE | Freq: Two times a day (BID) | ORAL | Status: DC
  Administered 2017-05-07 – 2017-05-10 (×6): 300 mg via ORAL
  Filled 2017-05-07 (×6): qty 1

## 2017-05-07 MED ORDER — DIPHENHYDRAMINE HCL 12.5 MG/5ML PO ELIX
12.5000 mg | ORAL_SOLUTION | ORAL | Status: DC | PRN
  Administered 2017-05-08: 25 mg via ORAL
  Filled 2017-05-07: qty 10

## 2017-05-07 MED ORDER — OXYCODONE HCL 5 MG PO TABS
10.0000 mg | ORAL_TABLET | ORAL | Status: DC | PRN
  Administered 2017-05-07 – 2017-05-08 (×4): 15 mg via ORAL
  Filled 2017-05-07 (×4): qty 3

## 2017-05-07 MED ORDER — HYDROMORPHONE HCL 1 MG/ML IJ SOLN: 0.2500 mg | INTRAMUSCULAR | Status: DC | PRN

## 2017-05-07 MED ORDER — METHOCARBAMOL 500 MG PO TABS
1000.0000 mg | ORAL_TABLET | Freq: Three times a day (TID) | ORAL | Status: DC
  Administered 2017-05-07 – 2017-05-10 (×9): 1000 mg via ORAL
  Filled 2017-05-07 (×10): qty 2

## 2017-05-07 MED ORDER — METHOCARBAMOL 1000 MG/10ML IJ SOLN
1000.0000 mg | Freq: Three times a day (TID) | INTRAVENOUS | Status: DC
  Filled 2017-05-07 (×12): qty 10

## 2017-05-07 MED ORDER — ONDANSETRON HCL 4 MG/2ML IJ SOLN
INTRAMUSCULAR | Status: DC | PRN
  Administered 2017-05-07: 4 mg via INTRAVENOUS

## 2017-05-07 MED ORDER — ACETAMINOPHEN 10 MG/ML IV SOLN
INTRAVENOUS | Status: DC | PRN
  Administered 2017-05-07: 1000 mg via INTRAVENOUS

## 2017-05-07 MED ORDER — METOCLOPRAMIDE HCL 5 MG PO TABS: 5.0000 mg | ORAL_TABLET | Freq: Three times a day (TID) | ORAL | Status: DC | PRN

## 2017-05-07 MED ORDER — ONDANSETRON HCL 4 MG/2ML IJ SOLN: 4.0000 mg | Freq: Four times a day (QID) | INTRAMUSCULAR | Status: DC | PRN

## 2017-05-07 MED ORDER — METHOCARBAMOL 1000 MG/10ML IJ SOLN: 1000.0000 mg | Freq: Three times a day (TID) | INTRAVENOUS | Status: DC

## 2017-05-07 MED ORDER — MIDAZOLAM HCL 2 MG/2ML IJ SOLN
INTRAMUSCULAR | Status: AC
  Filled 2017-05-07: qty 2

## 2017-05-07 MED ORDER — ALBUTEROL SULFATE HFA 108 (90 BASE) MCG/ACT IN AERS
INHALATION_SPRAY | RESPIRATORY_TRACT | Status: DC | PRN
  Administered 2017-05-07: 3 via RESPIRATORY_TRACT

## 2017-05-07 MED ORDER — TETANUS-DIPHTH-ACELL PERTUSSIS 5-2.5-18.5 LF-MCG/0.5 IM SUSP
0.5000 mL | Freq: Once | INTRAMUSCULAR | Status: AC
  Administered 2017-05-07: 0.5 mL via INTRAMUSCULAR
  Filled 2017-05-07: qty 0.5

## 2017-05-07 MED ORDER — METHOCARBAMOL 1000 MG/10ML IJ SOLN
1000.0000 mg | Freq: Three times a day (TID) | INTRAMUSCULAR | Status: DC
Start: 2017-05-07 — End: 2017-05-07

## 2017-05-07 MED ORDER — METOCLOPRAMIDE HCL 5 MG/ML IJ SOLN: 5.0000 mg | Freq: Three times a day (TID) | INTRAMUSCULAR | Status: DC | PRN

## 2017-05-07 MED ORDER — LIDOCAINE-EPINEPHRINE (PF) 2 %-1:200000 IJ SOLN
10.0000 mL | Freq: Once | INTRAMUSCULAR | Status: AC
  Administered 2017-05-07: 10 mL
  Filled 2017-05-07: qty 20

## 2017-05-07 SURGICAL SUPPLY — 88 items
BANDAGE ACE 4X5 VEL STRL LF (GAUZE/BANDAGES/DRESSINGS) ×3 IMPLANT
BANDAGE ACE 6X5 VEL STRL LF (GAUZE/BANDAGES/DRESSINGS) IMPLANT
BANDAGE ELASTIC 6 VELCRO ST LF (GAUZE/BANDAGES/DRESSINGS) ×3 IMPLANT
BANDAGE ESMARK 6X9 LF (GAUZE/BANDAGES/DRESSINGS) IMPLANT
BIT DRILL 2.9 CANN QC NONSTRL (BIT) ×3 IMPLANT
BIT DRILL CALIBRATED 4.3MMX365 (DRILL) ×1 IMPLANT
BIT DRILL CROWE PNT TWST 4.5MM (DRILL) ×1 IMPLANT
BNDG COHESIVE 4X5 TAN STRL (GAUZE/BANDAGES/DRESSINGS) ×3 IMPLANT
BNDG ESMARK 6X9 LF (GAUZE/BANDAGES/DRESSINGS)
BNDG GAUZE ELAST 4 BULKY (GAUZE/BANDAGES/DRESSINGS) ×6 IMPLANT
BNDG GAUZE STRTCH 6 (GAUZE/BANDAGES/DRESSINGS) IMPLANT
BRUSH SCRUB SURG 4.25 DISP (MISCELLANEOUS) ×6 IMPLANT
COVER MAYO STAND STRL (DRAPES) ×3 IMPLANT
COVER SURGICAL LIGHT HANDLE (MISCELLANEOUS) ×3 IMPLANT
DRAPE C-ARM 42X72 X-RAY (DRAPES) ×3 IMPLANT
DRAPE C-ARMOR (DRAPES) ×3 IMPLANT
DRAPE IMP U-DRAPE 54X76 (DRAPES) ×3 IMPLANT
DRAPE INCISE IOBAN 66X45 STRL (DRAPES) ×3 IMPLANT
DRAPE ORTHO SPLIT 77X108 STRL (DRAPES) ×4
DRAPE SURG ORHT 6 SPLT 77X108 (DRAPES) ×2 IMPLANT
DRAPE U-SHAPE 47X51 STRL (DRAPES) ×3 IMPLANT
DRILL CALIBRATED 4.3MMX365 (DRILL) ×3
DRILL CROWE POINT TWIST 4.5MM (DRILL) ×3
DRSG ADAPTIC 3X8 NADH LF (GAUZE/BANDAGES/DRESSINGS) IMPLANT
DRSG MEPILEX BORDER 4X4 (GAUZE/BANDAGES/DRESSINGS) ×3 IMPLANT
DRSG MEPITEL 4X7.2 (GAUZE/BANDAGES/DRESSINGS) ×3 IMPLANT
DRSG PAD ABDOMINAL 8X10 ST (GAUZE/BANDAGES/DRESSINGS) ×6 IMPLANT
ELECT REM PT RETURN 9FT ADLT (ELECTROSURGICAL) ×3
ELECTRODE REM PT RTRN 9FT ADLT (ELECTROSURGICAL) ×1 IMPLANT
EVACUATOR 1/8 PVC DRAIN (DRAIN) IMPLANT
GAUZE SPONGE 4X4 12PLY STRL (GAUZE/BANDAGES/DRESSINGS) ×3 IMPLANT
GAUZE XEROFORM 1X8 LF (GAUZE/BANDAGES/DRESSINGS) IMPLANT
GLOVE BIO SURGEON STRL SZ7.5 (GLOVE) ×3 IMPLANT
GLOVE BIO SURGEON STRL SZ8 (GLOVE) ×3 IMPLANT
GLOVE BIOGEL PI IND STRL 7.5 (GLOVE) ×1 IMPLANT
GLOVE BIOGEL PI IND STRL 8 (GLOVE) ×1 IMPLANT
GLOVE BIOGEL PI INDICATOR 7.5 (GLOVE) ×2
GLOVE BIOGEL PI INDICATOR 8 (GLOVE) ×2
GOWN STRL REUS W/ TWL LRG LVL3 (GOWN DISPOSABLE) ×2 IMPLANT
GOWN STRL REUS W/ TWL XL LVL3 (GOWN DISPOSABLE) ×1 IMPLANT
GOWN STRL REUS W/TWL LRG LVL3 (GOWN DISPOSABLE) ×4
GOWN STRL REUS W/TWL XL LVL3 (GOWN DISPOSABLE) ×2
GUIDEPIN 3.2X17.5 THRD DISP (PIN) ×3 IMPLANT
GUIDEWIRE BEAD TIP (WIRE) ×3 IMPLANT
HANDPIECE INTERPULSE COAX TIP (DISPOSABLE) ×2
K-WIRE ACE 1.6X6 (WIRE) ×6
KIT BASIN OR (CUSTOM PROCEDURE TRAY) ×3 IMPLANT
KIT ROOM TURNOVER OR (KITS) ×3 IMPLANT
KWIRE ACE 1.6X6 (WIRE) ×2 IMPLANT
MANIFOLD NEPTUNE II (INSTRUMENTS) ×3 IMPLANT
NAIL FEMORAL RETRO 9X420 (Nail) ×3 IMPLANT
NS IRRIG 1000ML POUR BTL (IV SOLUTION) ×3 IMPLANT
PACK ORTHO EXTREMITY (CUSTOM PROCEDURE TRAY) ×3 IMPLANT
PACK UNIVERSAL I (CUSTOM PROCEDURE TRAY) ×3 IMPLANT
PAD ARMBOARD 7.5X6 YLW CONV (MISCELLANEOUS) ×6 IMPLANT
PAD CAST 4YDX4 CTTN HI CHSV (CAST SUPPLIES) ×1 IMPLANT
PADDING CAST COTTON 4X4 STRL (CAST SUPPLIES) ×2
PADDING CAST COTTON 6X4 STRL (CAST SUPPLIES) ×3 IMPLANT
SCREW ACE CAN 4.0 40M (Screw) ×3 IMPLANT
SCREW ACE CAN 4.0 42M (Screw) ×3 IMPLANT
SCREW CORT TI DBL LEAD 5X32 (Screw) ×3 IMPLANT
SCREW CORT TI DBL LEAD 5X34 (Screw) ×3 IMPLANT
SCREW CORT TI DBL LEAD 5X56 (Screw) ×3 IMPLANT
SCREW CORT TI DBL LEAD 5X80 (Screw) ×6 IMPLANT
SET HNDPC FAN SPRY TIP SCT (DISPOSABLE) ×1 IMPLANT
SPONGE LAP 18X18 X RAY DECT (DISPOSABLE) ×6 IMPLANT
STAPLER VISISTAT 35W (STAPLE) ×3 IMPLANT
STOCKINETTE IMPERVIOUS 9X36 MD (GAUZE/BANDAGES/DRESSINGS) ×3 IMPLANT
STOCKINETTE IMPERVIOUS LG (DRAPES) ×3 IMPLANT
SUCTION FRAZIER TIP 10 FR DISP (SUCTIONS) ×3 IMPLANT
SUT ETHILON 3 0 PS 1 (SUTURE) ×6 IMPLANT
SUT PDS AB 0 CT 36 (SUTURE) ×6 IMPLANT
SUT PDS AB 2-0 CT1 27 (SUTURE) ×3 IMPLANT
SUT PROLENE 3 0 PS 2 (SUTURE) IMPLANT
SUT VIC AB 0 CT1 27 (SUTURE)
SUT VIC AB 0 CT1 27XBRD ANBCTR (SUTURE) IMPLANT
SUT VIC AB 2-0 CT1 27 (SUTURE)
SUT VIC AB 2-0 CT1 TAPERPNT 27 (SUTURE) IMPLANT
SUT VIC AB 2-0 CT3 27 (SUTURE) IMPLANT
SWAB CULTURE ESWAB REG 1ML (MISCELLANEOUS) IMPLANT
TOWEL OR 17X24 6PK STRL BLUE (TOWEL DISPOSABLE) ×3 IMPLANT
TOWEL OR 17X26 10 PK STRL BLUE (TOWEL DISPOSABLE) ×6 IMPLANT
TRAY FOLEY CATH SILVER 16FR (SET/KITS/TRAYS/PACK) ×3 IMPLANT
TUBE CONNECTING 12'X1/4 (SUCTIONS) ×1
TUBE CONNECTING 12X1/4 (SUCTIONS) ×2 IMPLANT
UNDERPAD 30X30 (UNDERPADS AND DIAPERS) ×3 IMPLANT
WATER STERILE IRR 1000ML POUR (IV SOLUTION) ×3 IMPLANT
YANKAUER SUCT BULB TIP NO VENT (SUCTIONS) ×6 IMPLANT

## 2017-05-07 NOTE — Progress Notes (Addendum)
Patient arrived to unit. Alert and oriented x 4, accompanied with girlfriend. Patient wiped with CHG wipes to his front, however patient refused for nurse  wipe his back with CHG wipes and also refused skin assessment to his back. Patient also had a wound to his right knee which was socked with bright red blood. I informed patient that his dressing will have to be change. Patient declined dressing change. MD orders reviewed with patient and patient verbalizes understanding.

## 2017-05-07 NOTE — Progress Notes (Signed)
Orthopedic Tech Progress Note Patient Details:  Joel Hansen [birth year] [id number]  Musculoskeletal Traction Type of Traction: Gilberto BetterHare Traction Traction Location: rle   Post Interventions Patient Tolerated: Fair Applied with drs assistance. Post reduction.  [name], [name] 05/07/2017, 1:03 AM

## 2017-05-07 NOTE — Transfer of Care (Signed)
Immediate Anesthesia Transfer of Care Note  Patient: Joel Hansen  Procedure(s) Performed: INTRAMEDULLARY (IM) RETROGRADE FEMORAL NAILING (Right ) IRRIGATION AND DEBRIDEMENT EXTREMITY (Right ) OPEN REDUCTION INTERNAL (ORIF) FIXATION PATELLA (Right )  Patient Location: PACU  Anesthesia Type:General  Level of Consciousness: awake, alert  and oriented  Airway & Oxygen Therapy: Patient Spontanous Breathing and Patient connected to nasal cannula oxygen  Post-op Assessment: Report given to RN and Post -op Vital signs reviewed and stable  Post vital signs: Reviewed and stable  Last Vitals:  Vitals:   05/07/17 1022 05/07/17 1522  BP:  115/82  Pulse:  96  Resp:  15  Temp:  36.8 C  SpO2: 98% 100%    Last Pain:  Vitals:   05/07/17 0505  TempSrc:   PainSc: 6          Complications: No apparent anesthesia complications

## 2017-05-07 NOTE — Anesthesia Preprocedure Evaluation (Addendum)
Anesthesia Evaluation  Patient identified by MRN, date of birth, ID band Patient awake    Reviewed: Allergy & Precautions, NPO status , Patient's Chart, lab work & pertinent test results  Airway Mallampati: II  TM Distance: >3 FB Neck ROM: Full    Dental  (+) Dental Advisory Given   Pulmonary neg pulmonary ROS, Current Smoker,    breath sounds clear to auscultation       Cardiovascular negative cardio ROS   Rhythm:Regular Rate:Normal     Neuro/Psych negative neurological ROS     GI/Hepatic negative GI ROS, Neg liver ROS,   Endo/Other  negative endocrine ROS  Renal/GU negative Renal ROS     Musculoskeletal   Abdominal   Peds  Hematology negative hematology ROS (+)   Anesthesia Other Findings   Reproductive/Obstetrics                            Lab Results  Component Value Date   WBC 15.4 (H) 05/07/2017   HGB 13.5 05/07/2017   HCT 40.7 05/07/2017   MCV 91.1 05/07/2017   PLT 200 05/07/2017   Lab Results  Component Value Date   CREATININE 1.03 05/07/2017   BUN 12 05/07/2017   NA 140 05/07/2017   K 3.1 (L) 05/07/2017   CL 107 05/07/2017   CO2 20 (L) 05/07/2017    Anesthesia Physical Anesthesia Plan  ASA: II  Anesthesia Plan: General   Post-op Pain Management:    Induction: Intravenous  PONV Risk Score and Plan: 3 and Midazolam, Dexamethasone, Ondansetron and Treatment may vary due to age or medical condition  Airway Management Planned: Oral ETT  Additional Equipment:   Intra-op Plan:   Post-operative Plan: Extubation in OR  Informed Consent: I have reviewed the patients History and Physical, chart, labs and discussed the procedure including the risks, benefits and alternatives for the proposed anesthesia with the patient or authorized representative who has indicated his/her understanding and acceptance.   Dental advisory given  Plan Discussed with:  CRNA  Anesthesia Plan Comments:         Anesthesia Quick Evaluation
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
## 2017-05-07 NOTE — ED Notes (Signed)
Dr. at bedside suturing pt.'s left eyelid laceration .

## 2017-05-07 NOTE — Progress Notes (Signed)
Responded to referral from on call chaplain to continue support to patient.  Patient preparing for surgery midmorning. Patient shared his delight about becoming a father in July.  Patient said he was dehydrated and wished he could have something but he is aware that because of pending operation e must wait.  Patient in good spirits. Provided listening, emotional support and presence.  Chaplain available as needed. Venida JarvisWatlington, Elodie Panameno, [place], [facility], Pager [phone]
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
## 2017-05-07 NOTE — ED Notes (Addendum)
Patient transported to CT scan in stable condition , hare traction intact , NS IV infusing .

## 2017-05-07 NOTE — Progress Notes (Signed)
I have discussed this case with the EDP.  At this time we will recommend CT scan of the right knee for his distal femur fracture and will place him in end of bed traction for pt comfort.  Full orthopedic consultation note to follow in the am.  Please keep NPO for possible surgery.
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
## 2017-05-07 NOTE — Brief Op Note (Signed)
05/07/2017  6:19 PM  PATIENT:  Joel Hansen  32 y.o. male  PRE-OPERATIVE DIAGNOSIS:   1. GRADE 2 OPEN PATELLA FRACTURE 2. RIGHT DISTAL FEMORAL SHAFT FRACTURE 3. OPEN RIGHT KNEE JOINT  POST-OPERATIVE DIAGNOSIS:   1. GRADE 2 OPEN PATELLA FRACTURE 2. RIGHT DISTAL FEMORAL SHAFT FRACTURE 3. OPEN RIGHT KNEE JOINT  PROCEDURE:  Procedure(s): 1. INTRAMEDULLARY (IM) RETROGRADE FEMORAL NAILING (Right) with [employer] 9 x 420 mm statically locked nail 2. IRRIGATION AND DEBRIDEMENT EXTREMITY (Right) PATELLA 3. ARTHROTOMY AND LAVAGE OF OPEN KNEE JOINT (Right) 4. OPEN REDUCTION INTERNAL (ORIF) FIXATION PATELLA (Right)  SURGEON:  Surgeon(s) and Role:    Myrene Galas* Kalanie Fewell, MD - Primary  PHYSICIAN ASSISTANT: Montez MoritaKeith Paul, PA-C  ANESTHESIA:   general  EBL:  50 mL   BLOOD ADMINISTERED:none  DRAINS: none   LOCAL MEDICATIONS USED:  NONE  SPECIMEN:  No Specimen  DISPOSITION OF SPECIMEN:  N/A  COUNTS:  YES  TOURNIQUET:  * Missing tourniquet times found for documented tourniquets in log: [id number] *  DICTATION: .Other Dictation: Dictation Number [id number]  PLAN OF CARE: Admit to inpatient   PATIENT DISPOSITION:  PACU - guarded condition.   Delay start of Pharmacological VTE agent (>24hrs) due to surgical blood loss or risk of bleeding: no

## 2017-05-07 NOTE — Anesthesia Procedure Notes (Signed)
Procedure Name: Intubation Date/Time: 05/07/2017 12:21 PM Performed by: Marena ChancyBeckner, Annie Roseboom S, CRNA Pre-anesthesia Checklist: Patient identified, Emergency Drugs available, Suction available and Patient being monitored Patient Re-evaluated:Patient Re-evaluated prior to induction Oxygen Delivery Method: Circle System Utilized Preoxygenation: Pre-oxygenation with 100% oxygen Induction Type: IV induction Ventilation: Mask ventilation without difficulty Laryngoscope Size: Miller and 2 Grade View: Grade I Tube type: Oral Tube size: 8.0 mm Number of attempts: 1 Airway Equipment and Method: Stylet and Oral airway Placement Confirmation: ETT inserted through vocal cords under direct vision,  positive ETCO2 and breath sounds checked- equal and bilateral Tube secured with: Tape Dental Injury: Teeth and Oropharynx as per pre-operative assessment

## 2017-05-08 ENCOUNTER — Encounter (HOSPITAL_COMMUNITY): Payer: Self-pay | Admitting: Orthopedic Surgery

## 2017-05-08 ENCOUNTER — Inpatient Hospital Stay (HOSPITAL_COMMUNITY): Payer: Worker's Compensation

## 2017-05-08 LAB — BASIC METABOLIC PANEL
Anion gap: 7 (ref 5–15)
BUN: 7 mg/dL (ref 6–20)
CALCIUM: 8.5 mg/dL — AB (ref 8.9–10.3)
CO2: 25 mmol/L (ref 22–32)
CREATININE: 0.93 mg/dL (ref 0.61–1.24)
Chloride: 106 mmol/L (ref 101–111)
GFR calc Af Amer: >60 mL/min (ref >60)
GFR calc non Af Amer: >60 mL/min (ref >60)
Glucose, Bld: 121 mg/dL — ABNORMAL HIGH (ref 65–99)
Potassium: 4.2 mmol/L (ref 3.5–5.1)
Sodium: 138 mmol/L (ref 135–145)

## 2017-05-08 LAB — CBC
HCT: 29.9 % — ABNORMAL LOW (ref 39.0–52.0)
Hemoglobin: 9.8 g/dL — ABNORMAL LOW (ref 13.0–17.0)
MCH: 30.1 pg (ref 26.0–34.0)
MCHC: 32.8 g/dL (ref 30.0–36.0)
MCV: 91.7 fL (ref 78.0–100.0)
PLATELETS: 208 10*3/uL (ref 150–400)
RBC: 3.26 MIL/uL — ABNORMAL LOW (ref 4.22–5.81)
RDW: 13.6 % (ref 11.5–15.5)
WBC: 13.1 10*3/uL — ABNORMAL HIGH (ref 4.0–10.5)

## 2017-05-08 LAB — GLUCOSE, CAPILLARY: Glucose-Capillary: 119 mg/dL — ABNORMAL HIGH (ref 65–99)

## 2017-05-08 MED ORDER — ENOXAPARIN SODIUM 40 MG/0.4ML ~~LOC~~ SOLN
40.0000 mg | SUBCUTANEOUS | Status: DC
  Administered 2017-05-08 – 2017-05-10 (×3): 40 mg via SUBCUTANEOUS
  Filled 2017-05-08 (×3): qty 0.4

## 2017-05-08 MED ORDER — GADOBENATE DIMEGLUMINE 529 MG/ML IV SOLN
20.0000 mL | Freq: Once | INTRAVENOUS | Status: AC | PRN
  Administered 2017-05-08: 20 mL via INTRAVENOUS

## 2017-05-08 MED ORDER — OXYCODONE HCL 5 MG PO TABS
5.0000 mg | ORAL_TABLET | ORAL | Status: DC | PRN
  Administered 2017-05-08 – 2017-05-10 (×6): 10 mg via ORAL
  Filled 2017-05-08 (×7): qty 2

## 2017-05-08 MED ORDER — OXYCODONE-ACETAMINOPHEN 7.5-325 MG PO TABS
1.0000 | ORAL_TABLET | Freq: Four times a day (QID) | ORAL | Status: DC | PRN
  Filled 2017-05-08: qty 1
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
## 2017-05-08 NOTE — Progress Notes (Signed)
OT Cancellation Note  Patient Details Name: Joel Hansen MRN: [id number] DOB: Dec 28, 1985   Cancelled Treatment:    Reason Eval/Treat Not Completed: Patient at procedure or test/ unavailable. Will follow up as time allows.  Gaye AlkenBailey A Akshay Spang M.S., OTR/L Pager: [phone]  05/08/2017, 1:41 PM

## 2017-05-08 NOTE — Anesthesia Postprocedure Evaluation (Signed)
Anesthesia Post Note  Patient: Joel Hansen  Procedure(s) Performed: INTRAMEDULLARY (IM) RETROGRADE FEMORAL NAILING (Right ) IRRIGATION AND DEBRIDEMENT EXTREMITY (Right ) OPEN REDUCTION INTERNAL (ORIF) FIXATION PATELLA (Right )     Patient location during evaluation: PACU Anesthesia Type: General Level of consciousness: awake and alert, oriented and awake Pain management: pain level controlled Vital Signs Assessment: post-procedure vital signs reviewed and stable Respiratory status: spontaneous breathing, nonlabored ventilation and respiratory function stable Cardiovascular status: blood pressure returned to baseline and stable Postop Assessment: no apparent nausea or vomiting Anesthetic complications: no    Last Vitals:  Vitals:   05/07/17 2220 05/08/17 0458  BP: (!) 116/46 (!) 112/45  Pulse: 62 65  Resp: 18 18  Temp: 36.8 C 37.5 C  SpO2: 96% 96%    Last Pain:  Vitals:   05/08/17 0629  TempSrc:   PainSc: 6                  Cecile HearingStephen Edward Turk
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
## 2017-05-09 LAB — BASIC METABOLIC PANEL
Anion gap: 9 (ref 5–15)
BUN: 6 mg/dL (ref 6–20)
CHLORIDE: 103 mmol/L (ref 101–111)
CO2: 25 mmol/L (ref 22–32)
Calcium: 8.5 mg/dL — ABNORMAL LOW (ref 8.9–10.3)
Creatinine, Ser: 0.93 mg/dL (ref 0.61–1.24)
GFR calc Af Amer: >60 mL/min (ref >60)
GFR calc non Af Amer: >60 mL/min (ref >60)
GLUCOSE: 97 mg/dL (ref 65–99)
POTASSIUM: 4.1 mmol/L (ref 3.5–5.1)
Sodium: 137 mmol/L (ref 135–145)

## 2017-05-09 LAB — GLUCOSE, CAPILLARY: GLUCOSE-CAPILLARY: 101 mg/dL — AB (ref 65–99)

## 2017-05-09 NOTE — Progress Notes (Signed)
Orthopaedic Trauma Service (OTS)  2 Days Post-Op Procedure(s) (LRB): INTRAMEDULLARY (IM) RETROGRADE FEMORAL NAILING (Right) IRRIGATION AND DEBRIDEMENT EXTREMITY (Right) OPEN REDUCTION INTERNAL (ORIF) FIXATION PATELLA (Right)  Subjective: Patient reports pain as moderate. No BM since injury.  Objective: Current Vitals Blood pressure (!) 119/56, pulse 69, temperature 99.5 F (37.5 C), temperature source Oral, resp. rate 18, height 5\' 10"  (1.778 m), weight 83 kg (183 lb), SpO2 98 %. Vital signs in last 24 hours: Temp:  [98.8 F (37.1 C)-99.5 F (37.5 C)] 99.5 F (37.5 C) (03/16 0544) Pulse Rate:  [69-70] 69 (03/16 0544) BP: (119-124)/(47-56) 119/56 (03/16 0544) SpO2:  [98 %-99 %] 98 % (03/16 0544)  Intake/Output from previous day: 03/15 0701 - 03/16 0700 In: 1856.7 [I.V.:1856.7] Out: 600 [Urine:600]  LABS Recent Labs    05/07/17 0009 05/08/17 0548  HGB 13.5 9.8*   Recent Labs    05/07/17 0009 05/08/17 0548  WBC 15.4* 13.1*  RBC 4.47 3.26*  HCT 40.7 29.9*  PLT 200 208   Recent Labs    05/08/17 0548 05/09/17 0535  NA 138 137  K 4.2 4.1  CL 106 103  CO2 25 25  BUN 7 6  CREATININE 0.93 0.93  GLUCOSE 121* 97  CALCIUM 8.5* 8.5*   Recent Labs    05/07/17 0009  INR 1.02   MRI right short head bicep tear  Physical Exam RUE     Active flexion of elbow but sore and muscle deformity  Sens  Ax/R/M/U intact  Mot   Ax/ R/ PIN/ M/ AIN/ U intact  Brisk CR RLE Dressing removed; incision C/D/I  Edema/ swelling mild and controlled  Sens: DPN, SPN, TN intact  Motor: EHL, FHL, and lessor toe ext and flex all intact grossly  Brisk cap refill, warm to touch  Assessment/Plan: 2 Days Post-Op Procedure(s) (LRB): INTRAMEDULLARY (IM) RETROGRADE FEMORAL NAILING (Right) IRRIGATION AND DEBRIDEMENT EXTREMITY (Right) OPEN REDUCTION INTERNAL (ORIF) FIXATION PATELLA (Right) 1. PT/OT TDWB on LLE with unrestricted ROM of the knee 2. DVT proph Lovenox 3. Needs BM today 4.  Dr. Everardo PacificVarkey evaluating short head of right bicep tear; if no need for surgery may be d/c'd post BM 5. F/u 8-14 days  Myrene GalasMichael Normajean Nash, MD [facility] [phone] (p)
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
## 2017-05-10 ENCOUNTER — Encounter (HOSPITAL_COMMUNITY): Payer: Self-pay | Admitting: *Deleted

## 2017-05-10 DIAGNOSIS — S82001B Unspecified fracture of right patella, initial encounter for open fracture type I or II: Secondary | ICD-10-CM

## 2017-05-10 DIAGNOSIS — S46211A Strain of muscle, fascia and tendon of other parts of biceps, right arm, initial encounter: Secondary | ICD-10-CM

## 2017-05-10 HISTORY — DX: Strain of muscle, fascia and tendon of other parts of biceps, right arm, initial encounter: S46.211A

## 2017-05-10 HISTORY — DX: Unspecified fracture of right patella, initial encounter for open fracture type I or II: S82.001B

## 2017-05-10 LAB — BASIC METABOLIC PANEL
Anion gap: 9 (ref 5–15)
BUN: 11 mg/dL (ref 6–20)
CHLORIDE: 104 mmol/L (ref 101–111)
CO2: 27 mmol/L (ref 22–32)
Calcium: 9.1 mg/dL (ref 8.9–10.3)
Creatinine, Ser: 0.81 mg/dL (ref 0.61–1.24)
GFR calc Af Amer: >60 mL/min (ref >60)
GFR calc non Af Amer: >60 mL/min (ref >60)
GLUCOSE: 109 mg/dL — AB (ref 65–99)
POTASSIUM: 4.6 mmol/L (ref 3.5–5.1)
Sodium: 140 mmol/L (ref 135–145)

## 2017-05-10 MED ORDER — OXYCODONE-ACETAMINOPHEN 7.5-325 MG PO TABS: 1.0000 | ORAL_TABLET | Freq: Four times a day (QID) | ORAL | 0 refills | Status: DC | PRN

## 2017-05-10 MED ORDER — DOCUSATE SODIUM 100 MG PO CAPS: 100.0000 mg | ORAL_CAPSULE | Freq: Two times a day (BID) | ORAL | 0 refills | Status: DC

## 2017-05-10 MED ORDER — ENOXAPARIN SODIUM 40 MG/0.4ML ~~LOC~~ SOLN: 40.0000 mg | SUBCUTANEOUS | 0 refills | Status: DC

## 2017-05-10 MED ORDER — ENOXAPARIN (LOVENOX) PATIENT EDUCATION KIT
PACK | Freq: Once | Status: DC
  Filled 2017-05-10: qty 1

## 2017-05-10 MED ORDER — METHOCARBAMOL 500 MG PO TABS: 1000.0000 mg | ORAL_TABLET | Freq: Four times a day (QID) | ORAL | 1 refills | Status: DC | PRN

## 2017-05-10 MED ORDER — POLYETHYLENE GLYCOL 3350 17 G PO PACK: 17.0000 g | PACK | Freq: Every day | ORAL | 0 refills | Status: DC

## 2017-05-10 MED ORDER — OXYCODONE HCL 5 MG PO TABS: 5.0000 mg | ORAL_TABLET | Freq: Four times a day (QID) | ORAL | 0 refills | Status: DC | PRN

## 2017-05-10 MED ORDER — ENOXAPARIN (LOVENOX) PATIENT EDUCATION KIT: 1.0000 | PACK | Freq: Once | 0 refills | Status: AC
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
## 2017-05-10 NOTE — Discharge Instructions (Signed)
[facility] Discharge Instructions   General Discharge Instructions  WEIGHT BEARING STATUS: Touchdown weightbearing R leg. Weightbearing as tolerated Right arm  RANGE OF MOTION/ACTIVITY: unrestricted range of motion R leg and R arm   Wound Care: daily wound care as needed starting when you get home. See below   Discharge Wound Care Instructions  Do NOT apply any ointments, solutions or lotions to pin sites or surgical wounds.  These prevent needed drainage and even though solutions like hydrogen peroxide kill bacteria, they also damage cells lining the pin sites that help fight infection.  Applying lotions or ointments can keep the wounds moist and can cause them to breakdown and open up as well. This can increase the risk for infection. When in doubt call the office.  Surgical incisions should be dressed daily.  If any drainage is noted, use one layer of adaptic, then gauze, Kerlix, and an ace wrap.  Once the incision is completely dry and without drainage, it may be left open to air out.  Showering may begin 36-48 hours later.  Cleaning gently with soap and water.  Traumatic wounds should be dressed daily as well.    One layer of adaptic, gauze, Kerlix, then ace wrap.  The adaptic can be discontinued once the draining has ceased    If you have a wet to dry dressing: wet the gauze with saline the squeeze as much saline out so the gauze is moist (not soaking wet), place moistened gauze over wound, then place a dry gauze over the moist one, followed by Kerlix wrap, then ace wrap.   DVT/PE prophylaxis: lovenox 40 mg subcutaneous injection daily x 28 days   Diet: as you were eating previously.  Can use over the counter stool softeners and bowel preparations, such as Miralax, to help with bowel movements.  Narcotics can be constipating.  Be sure to drink plenty of fluids  PAIN MEDICATION USE AND EXPECTATIONS  You have likely been given narcotic medications to help control  your pain.  After a traumatic event that results in an fracture (broken bone) with or without surgery, it is ok to use narcotic pain medications to help control one's pain.  We understand that everyone responds to pain differently and each individual patient will be evaluated on a regular basis for the continued need for narcotic medications. Ideally, narcotic medication use should last no more than 6-8 weeks (coinciding with fracture healing).   As a patient it is your responsibility as well to monitor narcotic medication use and report the amount and frequency you use these medications when you come to your office visit.   We would also advise that if you are using narcotic medications, you should take a dose prior to therapy to maximize you participation.  IF YOU ARE ON NARCOTIC MEDICATIONS IT IS NOT PERMISSIBLE TO OPERATE A MOTOR VEHICLE (MOTORCYCLE/CAR/TRUCK/MOPED) OR HEAVY MACHINERY DO NOT MIX NARCOTICS WITH OTHER CNS (CENTRAL NERVOUS SYSTEM) DEPRESSANTS SUCH AS ALCOHOL   STOP SMOKING OR USING NICOTINE PRODUCTS!!!!  As discussed nicotine severely impairs your body's ability to heal surgical and traumatic wounds but also impairs bone healing.  Wounds and bone heal by forming microscopic blood vessels (angiogenesis) and nicotine is a vasoconstrictor (essentially, shrinks blood vessels).  Therefore, if vasoconstriction occurs to these microscopic blood vessels they essentially disappear and are unable to deliver necessary nutrients to the healing tissue.  This is one modifiable factor that you can do to dramatically increase your chances of healing your injury.    (  This means no smoking, no nicotine gum, patches, etc)  DO NOT USE NONSTEROIDAL ANTI-INFLAMMATORY DRUGS (NSAID'S)  Using products such as Advil (ibuprofen), Aleve (naproxen), Motrin (ibuprofen) for additional pain control during fracture healing can delay and/or prevent the healing response.  If you would like to take over the counter (OTC)  medication, Tylenol (acetaminophen) is ok.  However, some narcotic medications that are given for pain control contain acetaminophen as well. Therefore, you should not exceed more than 4000 mg of tylenol in a day if you do not have liver disease.  Also note that there are may OTC medicines, such as cold medicines and allergy medicines that my contain tylenol as well.  If you have any questions about medications and/or interactions please ask your doctor/PA or your pharmacist.      ICE AND ELEVATE INJURED/OPERATIVE EXTREMITY  Using ice and elevating the injured extremity above your heart can help with swelling and pain control.  Icing in a pulsatile fashion, such as 20 minutes on and 20 minutes off, can be followed.    Do not place ice directly on skin. Make sure there is a barrier between to skin and the ice pack.    Using frozen items such as frozen peas works well as the conform nicely to the are that needs to be iced.  USE AN ACE WRAP OR TED HOSE FOR SWELLING CONTROL  In addition to icing and elevation, Ace wraps or TED hose are used to help limit and resolve swelling.  It is recommended to use Ace wraps or TED hose until you are informed to stop.    When using Ace Wraps start the wrapping distally (farthest away from the body) and wrap proximally (closer to the body)   Example: If you had surgery on your leg or thing and you do not have a splint on, start the ace wrap at the toes and work your way up to the thigh        If you had surgery on your upper extremity and do not have a splint on, start the ace wrap at your fingers and work your way up to the upper arm  IF YOU ARE IN A SPLINT OR CAST DO NOT REMOVE IT FOR ANY REASON   If your splint gets wet for any reason please contact the office immediately. You may shower in your splint or cast as long as you keep it dry.  This can be done by wrapping in a cast cover or garbage back (or similar)  Do Not stick any thing down your splint or cast such  as pencils, money, or hangers to try and scratch yourself with.  If you feel itchy take benadryl as prescribed on the bottle for itching  IF YOU ARE IN A CAM BOOT (BLACK BOOT)  You may remove boot periodically. Perform daily dressing changes as noted below.  Wash the liner of the boot regularly and wear a sock when wearing the boot. It is recommended that you sleep in the boot until told otherwise  CALL THE OFFICE WITH ANY QUESTIONS OR CONCERNS: [phone]
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
## 2017-05-10 NOTE — Care Management (Signed)
[facility]  [facility]'s [facility] office unavailable on the weekend.  [facility] to deliver equipment prior to d/c.

## 2017-05-10 NOTE — Progress Notes (Signed)
Orthopaedic Trauma Service (OTS)  3 Days Post-Op Procedure(s) (LRB): INTRAMEDULLARY (IM) RETROGRADE FEMORAL NAILING (Right) IRRIGATION AND DEBRIDEMENT EXTREMITY (Right) OPEN REDUCTION INTERNAL (ORIF) FIXATION PATELLA (Right)  Subjective: Patient reports pain as mild and had BM yesterday. ready for d/c. 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  No role for surgery in this right bicep tear given location at the musculotendinous junction. Discussed with patient yesterday.  Objective: Current Vitals Blood pressure 125/65, pulse 68, temperature 99 F (37.2 C), temperature source Oral, resp. rate 16, height 5\' 10"  (1.778 m), weight 83 kg (183 lb), SpO2 100 %. Vital signs in last 24 hours: Temp:  [98.7 F (37.1 C)-99.8 F (37.7 C)] 99 F (37.2 C) (03/17 0700) Pulse Rate:  [68-86] 68 (03/17 0700) Resp:  [16-18] 16 (03/17 0700) BP: (125-137)/(50-65) 125/65 (03/17 0700) SpO2:  [100 %] 100 % (03/17 0700)  Intake/Output from previous day: 03/16 0701 - 03/17 0700 In: 0  Out: 1250 [Urine:1250]  LABS Recent Labs    05/08/17 0548  HGB 9.8*   Recent Labs    05/08/17 0548  WBC 13.1*  RBC 3.26*  HCT 29.9*  PLT 208   Recent Labs    05/09/17 0535 05/10/17 0530  NA 137 140  K 4.1 4.6  CL 103 104  CO2 25 27  BUN 6 11  CREATININE 0.93 0.81  GLUCOSE 97 109*  CALCIUM 8.5* 9.1   No results for input(s): LABPT, INR in the last 72 hours.   Physical Exam RUE  No change   Sens  Ax/R/M/U intact  Mot   Ax/ R/ PIN/ M/ AIN/ U intact  Brisk CR RLE Dressings intact, clean, dry  Edema/ swelling controlled  Sens: DPN, SPN, TN intact  Motor: EHL, FHL, and lessor toe ext and flex all intact grossly  Brisk cap refill, warm to touch  Assessment/Plan: 3 Days Post-Op Procedure(s) (LRB): INTRAMEDULLARY (IM) RETROGRADE FEMORAL NAILING (Right) IRRIGATION AND DEBRIDEMENT EXTREMITY (Right) OPEN REDUCTION INTERNAL (ORIF) FIXATION PATELLA (Right) 1. PT/OT TDWB on LLE with unrestricted ROM of  the knee 2. DVT proph Lovenox 3. F/u 8-14 days  Myrene GalasMichael Jeymi Hepp, MD [facility] [phone] (p)

## 2017-05-12 ENCOUNTER — Encounter (HOSPITAL_COMMUNITY): Payer: Self-pay | Admitting: Emergency Medicine

## 2017-05-18 ENCOUNTER — Other Ambulatory Visit (HOSPITAL_COMMUNITY): Payer: Self-pay | Admitting: Orthopedic Surgery

## 2017-05-18 DIAGNOSIS — M7989 Other specified soft tissue disorders: Principal | ICD-10-CM

## 2017-05-18 DIAGNOSIS — M79604 Pain in right leg: Secondary | ICD-10-CM

## 2017-05-18 DIAGNOSIS — M79605 Pain in left leg: Secondary | ICD-10-CM

## 2017-05-19 ENCOUNTER — Ambulatory Visit (HOSPITAL_COMMUNITY)
Admission: RE | Admit: 2017-05-19 | Discharge: 2017-05-19 | Disposition: A | Discharge status: Home / Self Care | Payer: 59 | Source: Ambulatory Visit | Attending: Orthopedic Surgery | Admitting: Orthopedic Surgery

## 2017-05-19 DIAGNOSIS — M7989 Other specified soft tissue disorders: Secondary | ICD-10-CM | POA: Diagnosis not present

## 2017-05-19 DIAGNOSIS — M79604 Pain in right leg: Secondary | ICD-10-CM

## 2017-05-19 DIAGNOSIS — Z96698 Presence of other orthopedic joint implants: Secondary | ICD-10-CM | POA: Diagnosis not present

## 2017-05-19 NOTE — Progress Notes (Signed)
Right lower extremity venous duplex has been completed. Negative for DVT. [facility] Magdalene PatriciaHandy's office.  05/19/17 11:10 AM Olen CordialGreg Julizza Sassone RVT

## 2017-05-27 ENCOUNTER — Encounter: Payer: Self-pay | Admitting: Orthopedic Surgery

## 2017-06-19 ENCOUNTER — Ambulatory Visit (HOSPITAL_COMMUNITY)
Admission: RE | Admit: 2017-06-19 | Discharge: 2017-06-19 | Disposition: A | Discharge status: Home / Self Care | Payer: 59 | Source: Ambulatory Visit | Attending: Student | Admitting: Student

## 2017-06-19 ENCOUNTER — Other Ambulatory Visit (HOSPITAL_COMMUNITY): Payer: Self-pay | Admitting: Student

## 2017-06-19 DIAGNOSIS — M7989 Other specified soft tissue disorders: Secondary | ICD-10-CM

## 2017-06-19 DIAGNOSIS — R52 Pain, unspecified: Secondary | ICD-10-CM | POA: Diagnosis present

## 2017-06-19 DIAGNOSIS — M79604 Pain in right leg: Secondary | ICD-10-CM

## 2017-06-19 DIAGNOSIS — R609 Edema, unspecified: Secondary | ICD-10-CM | POA: Insufficient documentation

## 2017-06-19 NOTE — Progress Notes (Signed)
Right lower extremity venous duplex has been completed. Negative for DVT. [facility] Dr. Luvenia StarchHaddix's office.  06/19/17 12:58 PM Olen CordialGreg Zemira Zehring RVT

## 2017-06-22 ENCOUNTER — Encounter: Payer: Self-pay | Admitting: Physical Medicine & Rehabilitation

## 2017-06-22 ENCOUNTER — Encounter
Payer: Worker's Compensation | Attending: Physical Medicine & Rehabilitation | Admitting: Physical Medicine & Rehabilitation

## 2017-06-22 DIAGNOSIS — F5101 Primary insomnia: Secondary | ICD-10-CM | POA: Insufficient documentation

## 2017-06-22 DIAGNOSIS — G4700 Insomnia, unspecified: Secondary | ICD-10-CM | POA: Diagnosis not present

## 2017-06-22 DIAGNOSIS — F0781 Postconcussional syndrome: Secondary | ICD-10-CM | POA: Insufficient documentation

## 2017-06-22 DIAGNOSIS — H9192 Unspecified hearing loss, left ear: Secondary | ICD-10-CM | POA: Diagnosis not present

## 2017-06-22 DIAGNOSIS — F329 Major depressive disorder, single episode, unspecified: Secondary | ICD-10-CM | POA: Diagnosis not present

## 2017-06-22 DIAGNOSIS — Z9889 Other specified postprocedural states: Secondary | ICD-10-CM | POA: Diagnosis not present

## 2017-06-22 DIAGNOSIS — F17210 Nicotine dependence, cigarettes, uncomplicated: Secondary | ICD-10-CM | POA: Diagnosis not present

## 2017-06-22 MED ORDER — SERTRALINE HCL 25 MG PO TABS: 25.0000 mg | ORAL_TABLET | Freq: Every day | ORAL | 4 refills | Status: DC

## 2017-06-22 MED ORDER — CLONAZEPAM 0.5 MG PO TABS: 0.5000 mg | ORAL_TABLET | Freq: Every day | ORAL | 2 refills | Status: DC

## 2017-06-22 NOTE — Patient Instructions (Signed)
PLEASE FEEL FREE TO CALL OUR OFFICE WITH ANY PROBLEMS OR QUESTIONS ([phone])      
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
## 2017-07-22 ENCOUNTER — Encounter: Payer: Worker's Compensation | Admitting: Physical Medicine & Rehabilitation

## 2017-08-13 ENCOUNTER — Encounter

## 2017-08-13 ENCOUNTER — Encounter: Payer: Worker's Compensation | Attending: Physical Medicine & Rehabilitation | Admitting: Psychology

## 2017-08-13 DIAGNOSIS — F5101 Primary insomnia: Secondary | ICD-10-CM | POA: Insufficient documentation

## 2017-08-13 DIAGNOSIS — F17210 Nicotine dependence, cigarettes, uncomplicated: Secondary | ICD-10-CM | POA: Insufficient documentation

## 2017-08-13 DIAGNOSIS — Z9889 Other specified postprocedural states: Secondary | ICD-10-CM | POA: Insufficient documentation

## 2017-08-13 DIAGNOSIS — F0781 Postconcussional syndrome: Secondary | ICD-10-CM | POA: Insufficient documentation

## 2017-08-13 DIAGNOSIS — H9192 Unspecified hearing loss, left ear: Secondary | ICD-10-CM | POA: Insufficient documentation

## 2017-08-13 DIAGNOSIS — G4700 Insomnia, unspecified: Secondary | ICD-10-CM | POA: Insufficient documentation

## 2017-08-13 DIAGNOSIS — F329 Major depressive disorder, single episode, unspecified: Secondary | ICD-10-CM | POA: Insufficient documentation

## 2018-05-20 NOTE — Telephone Encounter (Signed)
Open in error

## 2018-07-01 ENCOUNTER — Ambulatory Visit (INDEPENDENT_AMBULATORY_CARE_PROVIDER_SITE_OTHER): Payer: Self-pay | Admitting: Physician Assistant

## 2018-07-01 ENCOUNTER — Encounter: Payer: Self-pay | Admitting: Physician Assistant

## 2018-07-01 VITALS — BP 135/83 | HR 88 | Temp 98.3°F | Ht 73.0 in | Wt 191.0 lb

## 2018-07-01 DIAGNOSIS — F329 Major depressive disorder, single episode, unspecified: Secondary | ICD-10-CM

## 2018-07-01 DIAGNOSIS — F419 Anxiety disorder, unspecified: Secondary | ICD-10-CM

## 2018-07-01 DIAGNOSIS — F4323 Adjustment disorder with mixed anxiety and depressed mood: Secondary | ICD-10-CM | POA: Insufficient documentation

## 2018-07-01 DIAGNOSIS — F17210 Nicotine dependence, cigarettes, uncomplicated: Secondary | ICD-10-CM

## 2018-07-01 DIAGNOSIS — F1290 Cannabis use, unspecified, uncomplicated: Secondary | ICD-10-CM

## 2018-07-01 DIAGNOSIS — Z7689 Persons encountering health services in other specified circumstances: Secondary | ICD-10-CM

## 2018-07-01 DIAGNOSIS — F39 Unspecified mood [affective] disorder: Secondary | ICD-10-CM

## 2018-07-01 DIAGNOSIS — Z86718 Personal history of other venous thrombosis and embolism: Secondary | ICD-10-CM

## 2018-07-01 MED ORDER — SERTRALINE HCL 50 MG PO TABS: ORAL_TABLET | ORAL | 1 refills | Status: DC

## 2018-07-01 MED ORDER — CLONAZEPAM 0.5 MG PO TABS: 0.5000 mg | ORAL_TABLET | Freq: Once | ORAL | 0 refills | Status: DC | PRN

## 2018-07-01 NOTE — Patient Instructions (Addendum)
Contact [facility] regarding counseling options [phone]   Living With Anxiety  After being diagnosed with an anxiety disorder, you may be relieved to know why you have felt or behaved a certain way. It is natural to also feel overwhelmed about the treatment ahead and what it will mean for your life. With care and support, you can manage this condition and recover from it. How to cope with anxiety Dealing with stress Stress is your body's reaction to life changes and events, both good and bad. Stress can last just a few hours or it can be ongoing. Stress can play a major role in anxiety, so it is important to learn both how to cope with stress and how to think about it differently. Talk with your health care provider or a counselor to learn more about stress reduction. He or she may suggest some stress reduction techniques, such as:  Music therapy. This can include creating or listening to music that you enjoy and that inspires you.  Mindfulness-based meditation. This involves being aware of your normal breaths, rather than trying to control your breathing. It can be done while sitting or walking.  Centering prayer. This is a kind of meditation that involves focusing on a word, phrase, or sacred image that is meaningful to you and that brings you peace.  Deep breathing. To do this, expand your stomach and inhale slowly through your nose. Hold your breath for 3-5 seconds. Then exhale slowly, allowing your stomach muscles to relax.  Self-talk. This is a skill where you identify thought patterns that lead to anxiety reactions and correct those thoughts.  Muscle relaxation. This involves tensing muscles then relaxing them. Choose a stress reduction technique that fits your lifestyle and personality. Stress reduction techniques take time and practice. Set aside 5-15 minutes a day to do them. Therapists can offer training in these techniques. The training may be covered by  some insurance plans. Other things you can do to manage stress include:  Keeping a stress diary. This can help you learn what triggers your stress and ways to control your response.  Thinking about how you respond to certain situations. You may not be able to control everything, but you can control your reaction.  Making time for activities that help you relax, and not feeling guilty about spending your time in this way. Therapy combined with coping and stress-reduction skills provides the best chance for successful treatment. Medicines Medicines can help ease symptoms. Medicines for anxiety include:  Anti-anxiety drugs.  Antidepressants.  Beta-blockers. Medicines may be used as the main treatment for anxiety disorder, along with therapy, or if other treatments are not working. Medicines should be prescribed by a health care provider. Relationships Relationships can play a big part in helping you recover. Try to spend more time connecting with trusted friends and family members. Consider going to couples counseling, taking family education classes, or going to family therapy. Therapy can help you and others better understand the condition. How to recognize changes in your condition Everyone has a different response to treatment for anxiety. Recovery from anxiety happens when symptoms decrease and stop interfering with your daily activities at home or work. This may mean that you will start to:  Have better concentration and focus.  Sleep better.  Be less irritable.  Have more energy.  Have improved memory. It is important to recognize when your condition is getting worse. Contact your health care provider if your symptoms interfere with home or  work and you do not feel like your condition is improving. Where to find help and support: You can get help and support from these sources:  Self-help groups.  Online and [employer] organizations.  A trusted spiritual leader.  Couples  counseling.  Family education classes.  Family therapy. Follow these instructions at home:  Eat a healthy diet that includes plenty of vegetables, fruits, whole grains, low-fat dairy products, and lean protein. Do not eat a lot of foods that are high in solid fats, added sugars, or salt.  Exercise. Most adults should do the following: ? Exercise for at least 150 minutes each week. The exercise should increase your heart rate and make you sweat (moderate-intensity exercise). ? Strengthening exercises at least twice a week.  Cut down on caffeine, tobacco, alcohol, and other potentially harmful substances.  Get the right amount and quality of sleep. Most adults need 7-9 hours of sleep each night.  Make choices that simplify your life.  Take over-the-counter and prescription medicines only as told by your health care provider.  Avoid caffeine, alcohol, and certain over-the-counter cold medicines. These may make you feel worse. Ask your pharmacist which medicines to avoid.  Keep all follow-up visits as told by your health care provider. This is important. Questions to ask your health care provider  Would I benefit from therapy?  How often should I follow up with a health care provider?  How long do I need to take medicine?  Are there any long-term side effects of my medicine?  Are there any alternatives to taking medicine? Contact a health care provider if:  You have a hard time staying focused or finishing daily tasks.  You spend many hours a day feeling worried about everyday life.  You become exhausted by worry.  You start to have headaches, feel tense, or have nausea.  You urinate more than normal.  You have diarrhea. Get help right away if:  You have a racing heart and shortness of breath.  You have thoughts of hurting yourself or others. If you ever feel like you may hurt yourself or others, or have thoughts about taking your own life, get help right away. You  can go to your nearest emergency department or call:  Your local emergency services (911 in the [place]  A suicide crisis helpline, such as the National Suicide Prevention Lifeline at (825)269-49501-(843)212-8339. This is open 24-hours a day. Summary  Taking steps to deal with stress can help calm you.  Medicines cannot cure anxiety disorders, but they can help ease symptoms.  Family, friends, and partners can play a big part in helping you recover from an anxiety disorder. This information is not intended to replace advice given to you by your health care provider. Make sure you discuss any questions you have with your health care provider. Document Released: 02/05/2016 Document Revised: 02/05/2016 Document Reviewed: 02/05/2016 Elsevier Interactive Patient Education  2019 [employer]
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
## 2018-07-02 ENCOUNTER — Encounter: Payer: Self-pay | Admitting: Physician Assistant

## 2018-07-02 DIAGNOSIS — F329 Major depressive disorder, single episode, unspecified: Secondary | ICD-10-CM | POA: Insufficient documentation

## 2018-07-02 DIAGNOSIS — F419 Anxiety disorder, unspecified: Secondary | ICD-10-CM | POA: Insufficient documentation

## 2018-07-02 DIAGNOSIS — F1290 Cannabis use, unspecified, uncomplicated: Secondary | ICD-10-CM | POA: Insufficient documentation

## 2018-07-02 DIAGNOSIS — F39 Unspecified mood [affective] disorder: Secondary | ICD-10-CM | POA: Insufficient documentation

## 2018-07-02 DIAGNOSIS — Z86718 Personal history of other venous thrombosis and embolism: Secondary | ICD-10-CM | POA: Insufficient documentation

## 2018-07-29 ENCOUNTER — Ambulatory Visit: Payer: Self-pay | Admitting: Physician Assistant

## 2018-07-29 ENCOUNTER — Telehealth: Payer: Self-pay | Admitting: Physician Assistant

## 2018-07-29 NOTE — Telephone Encounter (Signed)
Doximity.. he's not coming into the office.

## 2018-07-29 NOTE — Telephone Encounter (Signed)
The schedule says [facility]. Is he coming to the office tomorrow or doing a virtual visit?

## 2018-07-29 NOTE — Telephone Encounter (Signed)
Pt called at 3:05.  He was called into work unexpectedly.   He has made another appt to come in tomorrow. Thanks

## 2018-07-30 ENCOUNTER — Encounter: Payer: Self-pay | Admitting: Physician Assistant

## 2018-07-30 ENCOUNTER — Ambulatory Visit (INDEPENDENT_AMBULATORY_CARE_PROVIDER_SITE_OTHER): Payer: Self-pay | Admitting: Physician Assistant

## 2018-07-30 DIAGNOSIS — F419 Anxiety disorder, unspecified: Secondary | ICD-10-CM

## 2018-07-30 DIAGNOSIS — F329 Major depressive disorder, single episode, unspecified: Secondary | ICD-10-CM

## 2018-07-30 DIAGNOSIS — F32A Depression, unspecified: Secondary | ICD-10-CM

## 2018-07-30 DIAGNOSIS — F39 Unspecified mood [affective] disorder: Secondary | ICD-10-CM

## 2018-07-30 DIAGNOSIS — F4323 Adjustment disorder with mixed anxiety and depressed mood: Secondary | ICD-10-CM

## 2018-07-30 MED ORDER — SERTRALINE HCL 50 MG PO TABS: 50.0000 mg | ORAL_TABLET | Freq: Every day | ORAL | 0 refills | Status: DC

## 2018-07-30 MED ORDER — CLONAZEPAM 0.5 MG PO TABS: 0.5000 mg | ORAL_TABLET | Freq: Once | ORAL | 1 refills | Status: DC | PRN
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
## 2019-02-16 IMAGING — DX DG FEMUR 2+V PORT*R*
4 series · 4 of 4 positions shown · non-contrast
Comparison: 05/07/2017

CLINICAL DATA: Femur fracture fixation

EXAM:
RIGHT FEMUR PORTABLE 2 VIEW

[femur ap (1 of 2)]
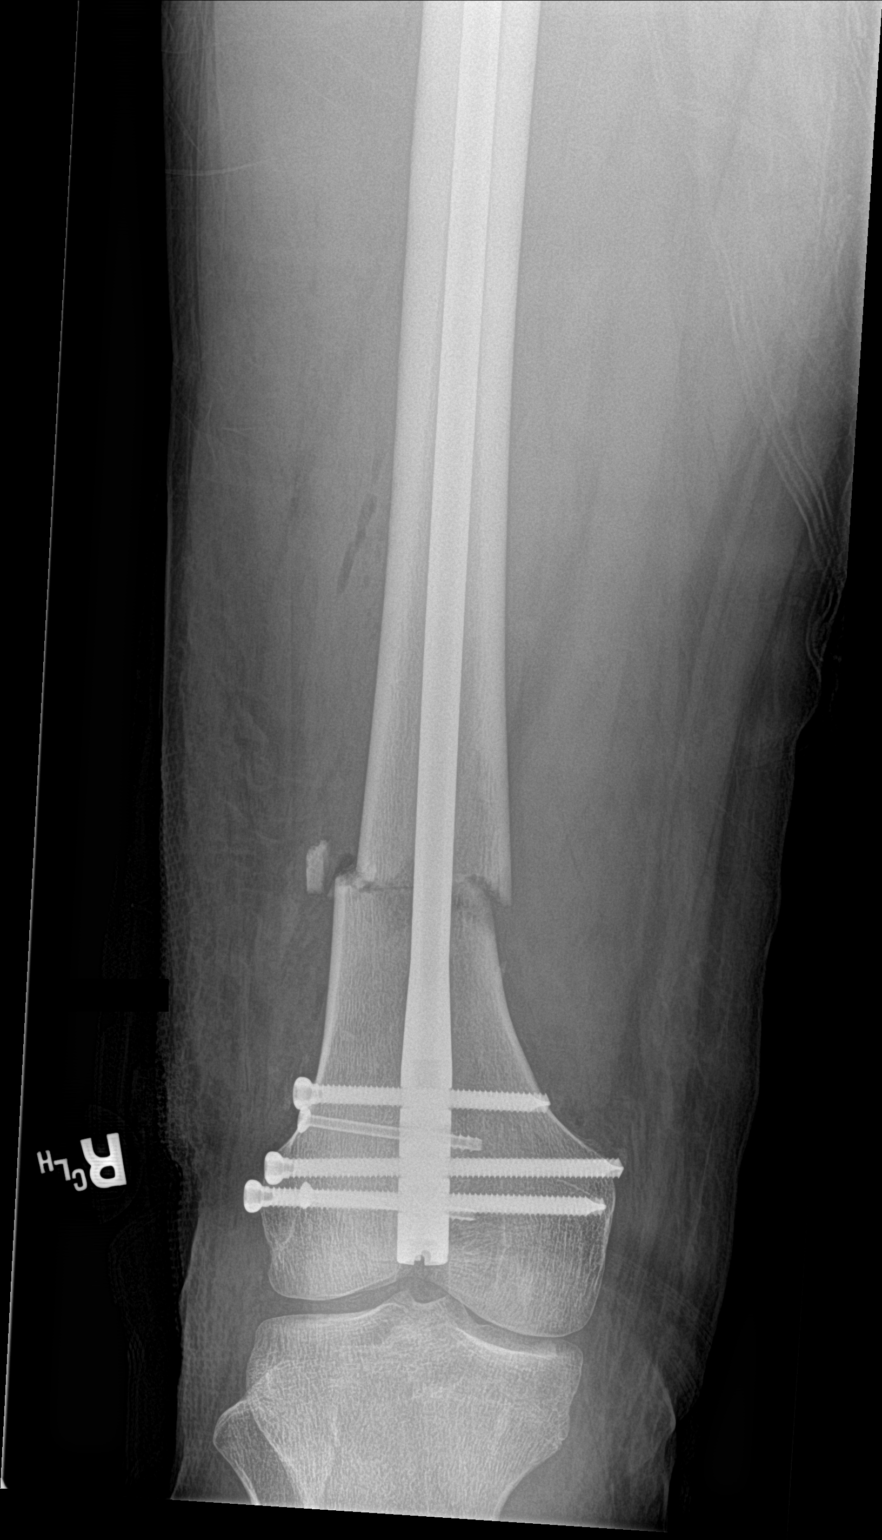

[femur ap (2 of 2)]
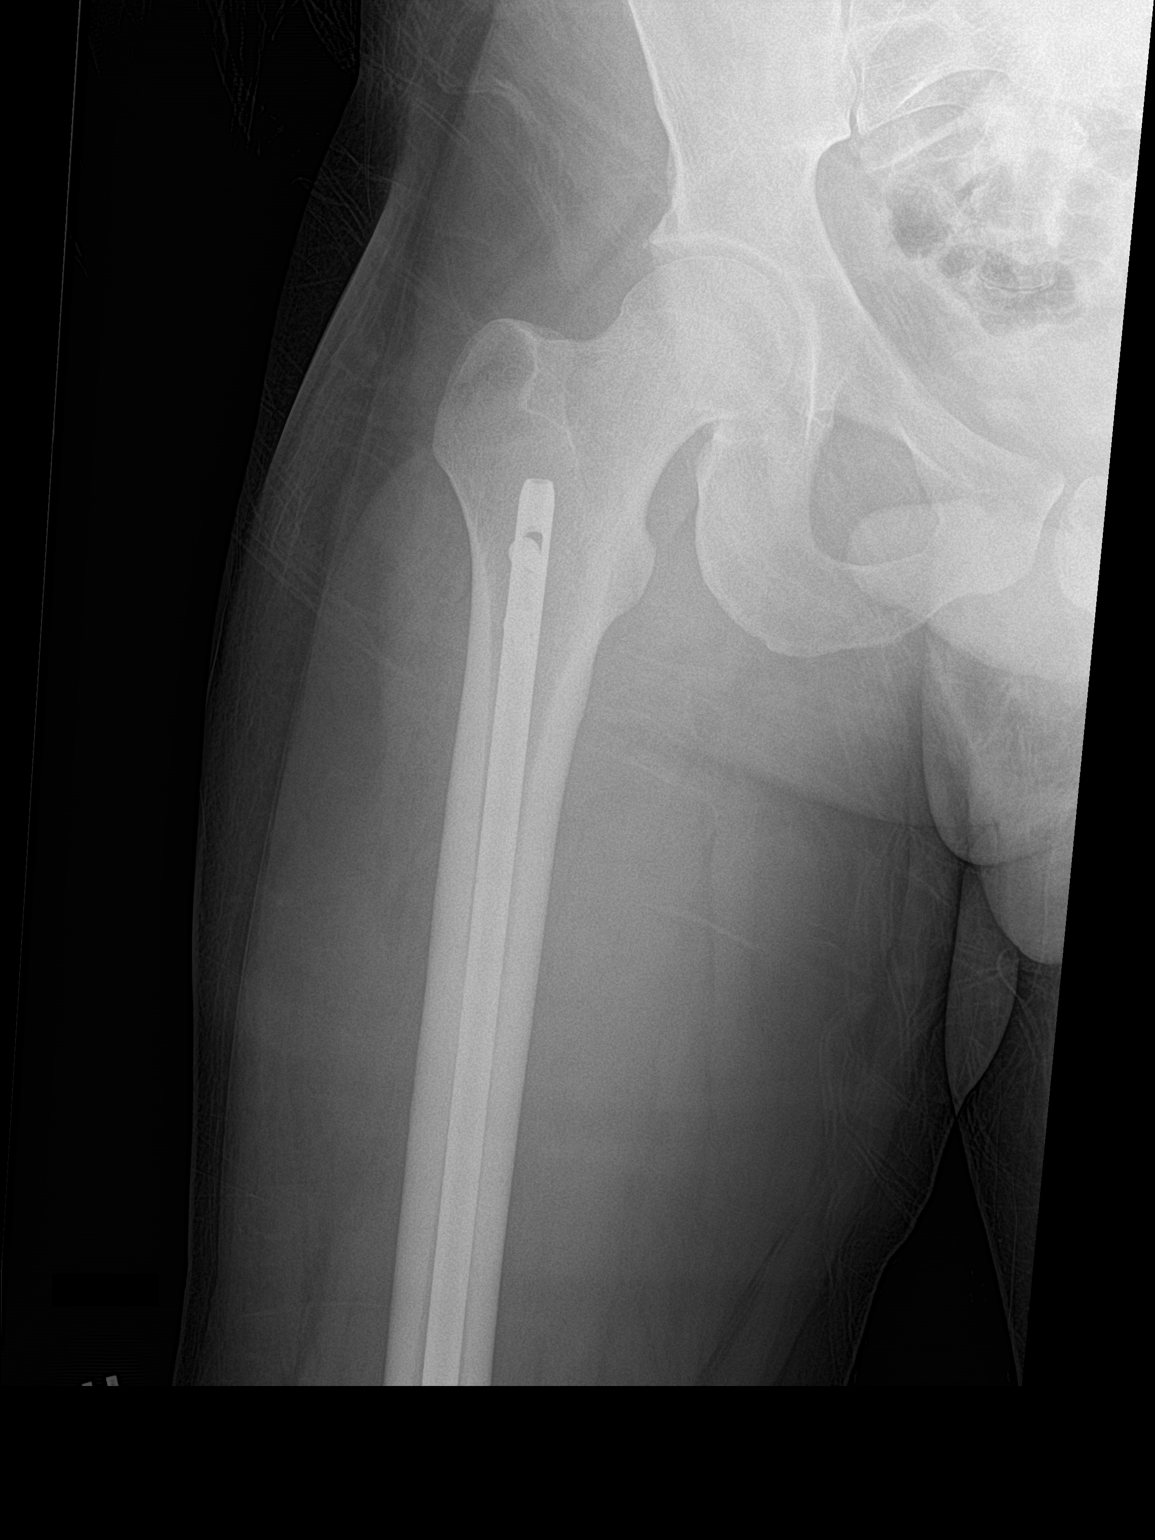

[femur lat (1 of 2)]
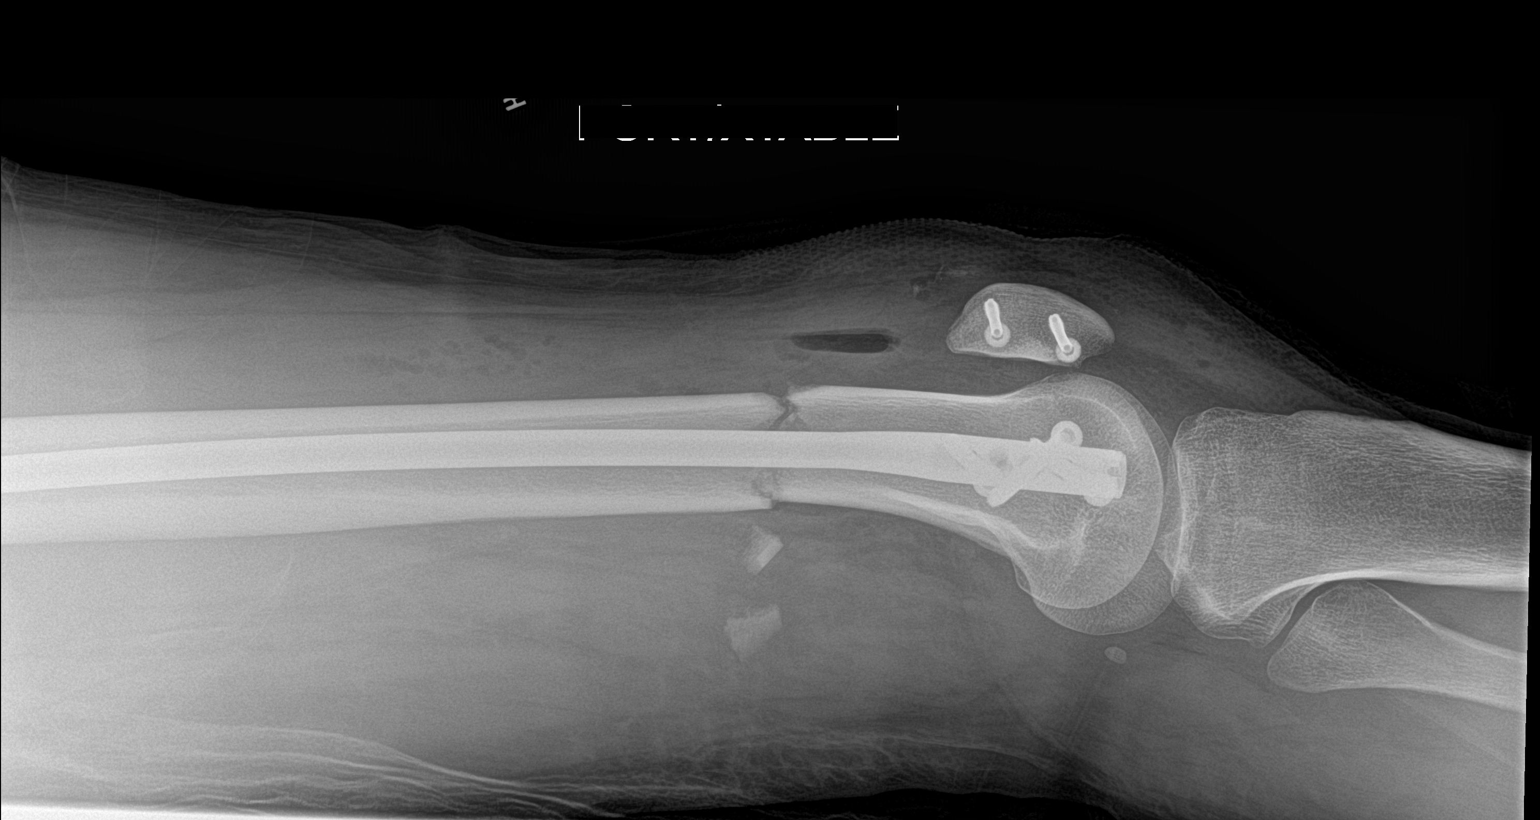

[femur lat (2 of 2)]
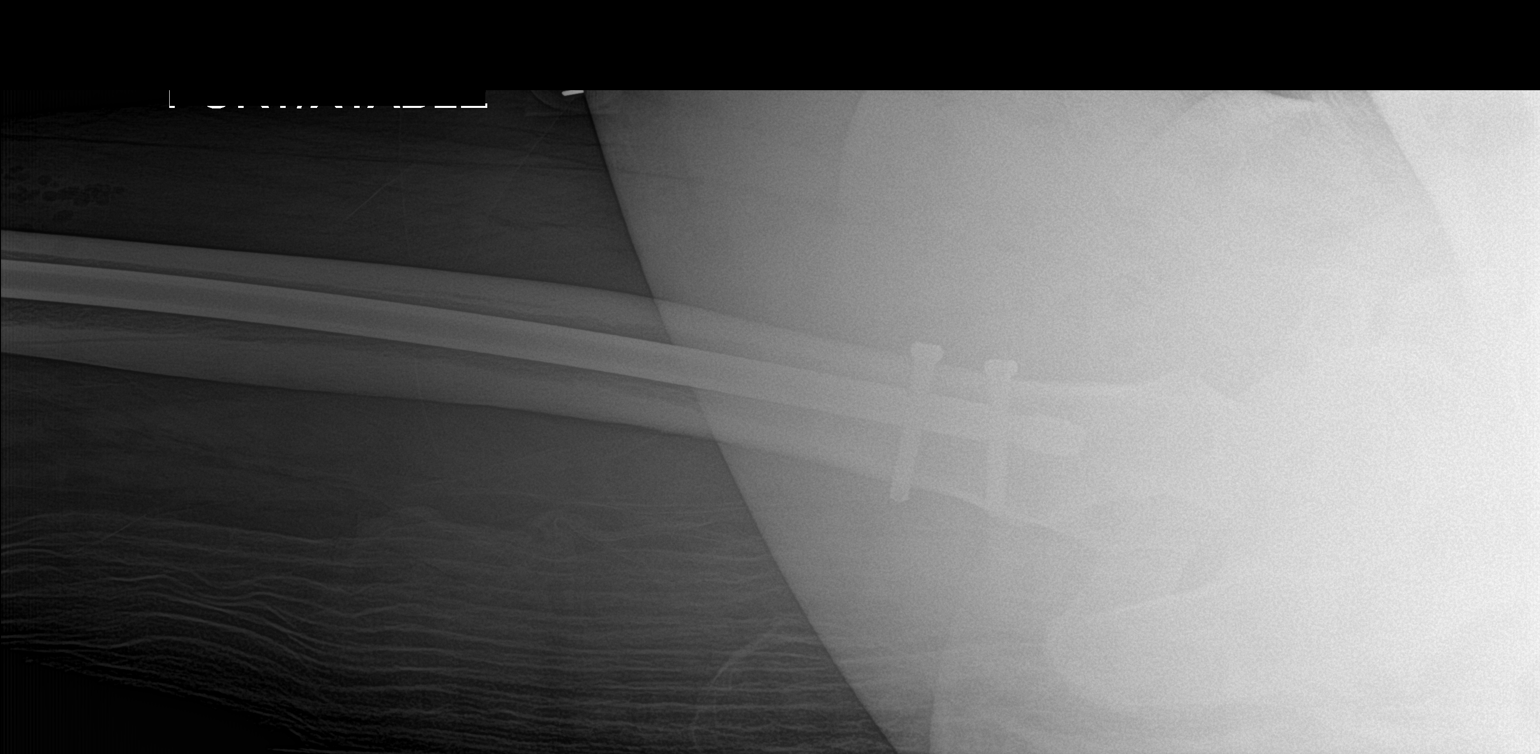

[4 of 4 positions shown; findings below may reference images not displayed]

FINDINGS: Transverse fracture distal femur has been reduced and fixed with a
locking intramedullary rod. Fracture in satisfactory alignment. Two
screws are present through the patella. Fluid and gas is present in
the suprapatellar bursa. Small bone fragments in the soft tissues
adjacent to the fracture
IMPRESSION: Intramedullary rod fixation of distal femur fracture now in
satisfactory alignment. 2 screws have been placed in the patella.

## 2020-09-10 ENCOUNTER — Other Ambulatory Visit: Payer: Self-pay

## 2020-09-10 ENCOUNTER — Emergency Department (INDEPENDENT_AMBULATORY_CARE_PROVIDER_SITE_OTHER): Payer: BC Managed Care – PPO

## 2020-09-10 ENCOUNTER — Emergency Department (INDEPENDENT_AMBULATORY_CARE_PROVIDER_SITE_OTHER)
Admission: EM | Admit: 2020-09-10 | Discharge: 2020-09-10 | Disposition: A | Discharge status: Home / Self Care | Payer: BC Managed Care – PPO | Source: Home / Self Care | Attending: Family Medicine | Admitting: Family Medicine

## 2020-09-10 DIAGNOSIS — R2243 Localized swelling, mass and lump, lower limb, bilateral: Secondary | ICD-10-CM | POA: Diagnosis not present

## 2020-09-10 DIAGNOSIS — M25561 Pain in right knee: Secondary | ICD-10-CM

## 2020-09-10 DIAGNOSIS — G8929 Other chronic pain: Secondary | ICD-10-CM

## 2020-09-10 DIAGNOSIS — M7989 Other specified soft tissue disorders: Secondary | ICD-10-CM

## 2020-09-10 DIAGNOSIS — R52 Pain, unspecified: Secondary | ICD-10-CM

## 2020-09-10 LAB — CBC WITH DIFFERENTIAL/PLATELET
Absolute Monocytes: 880 cells/uL (ref 200–950)
Basophils Absolute: 112 cells/uL (ref 0–200)
Basophils Relative: 0.9 %
Eosinophils Absolute: 335 cells/uL (ref 15–500)
Eosinophils Relative: 2.7 %
HCT: 38.9 % (ref 38.5–50.0)
Hemoglobin: 13.3 g/dL (ref 13.2–17.1)
Lymphs Abs: 2567 cells/uL (ref 850–3900)
MCH: 31 pg (ref 27.0–33.0)
MCHC: 34.2 g/dL (ref 32.0–36.0)
MCV: 90.7 fL (ref 80.0–100.0)
MPV: 9.7 fL (ref 7.5–12.5)
Monocytes Relative: 7.1 %
Neutro Abs: 8506 cells/uL — ABNORMAL HIGH (ref 1500–7800)
Neutrophils Relative %: 68.6 %
Platelets: 324 10*3/uL (ref 140–400)
RBC: 4.29 10*6/uL (ref 4.20–5.80)
RDW: 12.6 % (ref 11.0–15.0)
Total Lymphocyte: 20.7 %
WBC: 12.4 10*3/uL — ABNORMAL HIGH (ref 3.8–10.8)

## 2020-09-10 LAB — COMPLETE METABOLIC PANEL WITH GFR
AG Ratio: 1.9 (calc) (ref 1.0–2.5)
ALT: 15 U/L (ref 9–46)
AST: 15 U/L (ref 10–40)
Albumin: 4.6 g/dL (ref 3.6–5.1)
Alkaline phosphatase (APISO): 97 U/L (ref 36–130)
BUN: 14 mg/dL (ref 7–25)
CO2: 24 mmol/L (ref 20–32)
Calcium: 9.6 mg/dL (ref 8.6–10.3)
Chloride: 110 mmol/L (ref 98–110)
Creat: 0.92 mg/dL (ref 0.60–1.26)
Globulin: 2.4 g/dL (calc) (ref 1.9–3.7)
Glucose, Bld: 91 mg/dL (ref 65–99)
Potassium: 4.2 mmol/L (ref 3.5–5.3)
Sodium: 142 mmol/L (ref 135–146)
Total Bilirubin: 0.3 mg/dL (ref 0.2–1.2)
Total Protein: 7 g/dL (ref 6.1–8.1)
eGFR: 113 mL/min/{1.73_m2} (ref >=60)

## 2020-09-10 LAB — SEDIMENTATION RATE: Sed Rate: 9 mm/h (ref 0–15)

## 2020-09-10 MED ORDER — PREDNISONE 10 MG (21) PO TBPK: ORAL_TABLET | Freq: Every day | ORAL | 0 refills | Status: DC

## 2020-09-10 NOTE — ED Triage Notes (Signed)
Pt c/o pain and swelling in both legs since Saturday. Warm to touch intermittently. Painful to touch and walk. Some splotchy marks all over both legs. Pain 8/10

## 2020-09-10 NOTE — Discharge Instructions (Addendum)
Take the prednisone as directed This should help both the knee pain and the skin bumps Stay off your feet and elevate legs until swelling improves I have done lab work.  You can check results on MyChart.  A nurse will call you if anything is abnormal. You need to follow-up with a primary care doctor for any abnormal results.  The skin lumps and swelling could be from an infection, or inflammatory disease like rheumatoid arthritis.  If they do not go away with the steroid, additional testing is needed.  If your knee pain persist, the primary care doctor has a sports medicine doctor

## 2020-09-11 DIAGNOSIS — D72829 Elevated white blood cell count, unspecified: Secondary | ICD-10-CM | POA: Insufficient documentation
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
## 2020-09-12 ENCOUNTER — Telehealth: Payer: Self-pay

## 2020-09-12 NOTE — Telephone Encounter (Signed)
Late entry for 09/11/20 @ 1930 Attempted to call patient three times throughout the day d/t Dr. Delton See requesting f/u from 7/18 visit to [facility]. Each time there was no answer and no personalization to VM; therefore, no message left. Dr. Delton See is aware of this.

## 2020-09-16 NOTE — Progress Notes (Signed)
Patient called on 7/18 by Dayton Scrape, RN - see phone notes. Pt called again to day to review labs & to see how he was doing. No answer- voice mail left w/ follow up # for [facility] & hours available for call back .

## 2021-02-15 ENCOUNTER — Encounter: Payer: Self-pay | Admitting: Family Medicine

## 2021-02-15 ENCOUNTER — Ambulatory Visit: Payer: BC Managed Care – PPO | Admitting: Family Medicine

## 2021-02-15 ENCOUNTER — Other Ambulatory Visit: Payer: Self-pay

## 2021-02-15 VITALS — BP 130/89 | HR 80 | Temp 98.1°F | Ht 72.0 in | Wt 210.1 lb

## 2021-02-15 DIAGNOSIS — Z113 Encounter for screening for infections with a predominantly sexual mode of transmission: Secondary | ICD-10-CM | POA: Diagnosis not present

## 2021-02-15 DIAGNOSIS — K1370 Unspecified lesions of oral mucosa: Secondary | ICD-10-CM | POA: Diagnosis not present

## 2021-02-15 MED ORDER — MAGIC MOUTHWASH W/LIDOCAINE: 15.0000 mL | Freq: Four times a day (QID) | ORAL | 11 refills | Status: DC | PRN

## 2021-02-15 NOTE — Addendum Note (Signed)
Addended by: Delfino Lovett on: 02/15/2021 03:27 PM   Modules accepted: Orders

## 2021-02-15 NOTE — Progress Notes (Signed)
Acute Office Visit  Subjective:    Patient ID: Joel Hansen, male    DOB: 02-06-1986, 35 y.o.   MRN: [id number]  Chief Complaint  Patient presents with   Exposure to STD    Exposure to STD  Patient is in today for STD exposure.   Patient reports his significant other cheated on him a few months ago. They got back together a little over 2 months ago and soon after she tested positive for an STD after having some cramping. She told him that he should probably get tested, but she wouldn't tell him what she was positive for. He denies any genital symptoms, but he has noticed a sore to the roof of his mouth. States it is a small bump behind his front teeth that is sore and sometimes stings when he runs his tongue over it or tries to eat/drink. He would like to be tested for everything    Past Medical History:  Diagnosis Date   Anxiety attack    Bipolar 1 disorder ([place])    Nicotine dependence 05/07/2017   Open displaced fracture of right patella 05/10/2017   Right femoral shaft fracture ([place]), distal third 05/07/2017   Tear of right biceps muscle, short head 05/10/2017    Past Surgical History:  Procedure Laterality Date   FEMUR IM NAIL Right 05/07/2017   Procedure: INTRAMEDULLARY (IM) RETROGRADE FEMORAL NAILING;  Surgeon: Altamese Bonanza, MD;  Location: [facility];  Service: Orthopedics;  Laterality: Right;   I & D EXTREMITY Right 05/07/2017   Procedure: IRRIGATION AND DEBRIDEMENT EXTREMITY;  Surgeon: Altamese Duarte, MD;  Location: [facility];  Service: Orthopedics;  Laterality: Right;   ORIF PATELLA Right 05/07/2017   Procedure: OPEN REDUCTION INTERNAL (ORIF) FIXATION PATELLA;  Surgeon: Altamese San Leanna, MD;  Location: [facility];  Service: Orthopedics;  Laterality: Right;   UPPER GASTROINTESTINAL ENDOSCOPY      Family History  Problem Relation Age of Onset   Diabetes Mother    Heart disease Mother     Social History   Socioeconomic History   Marital status: Single    Spouse name: Not on file    Number of children: Not on file   Years of education: Not on file   Highest education level: Not on file  Occupational History   Not on file  Tobacco Use   Smoking status: Heavy Smoker    Packs/day: 1.50    Years: 15.00    Pack years: 22.50    Types: Cigarettes   Smokeless tobacco: Never  Vaping Use   Vaping Use: Never used  Substance and Sexual Activity   Alcohol use: Not Currently   Drug use: No   Sexual activity: Yes    Birth control/protection: None  Other Topics Concern   Not on file  Social History Narrative   ** Merged History Encounter **       Social Determinants of Health   Financial Resource Strain: Not on file  Food Insecurity: Not on file  Transportation Needs: Not on file  Physical Activity: Not on file  Stress: Not on file  Social Connections: Not on file  Intimate Partner Violence: Not on file    Outpatient Medications Prior to Visit  Medication Sig Dispense Refill   predniSONE (STERAPRED UNI-PAK 21 TAB) 10 MG (21) TBPK tablet Take by mouth daily. Take 6 tabs by mouth daily  for 2 days, then 5 tabs for 2 days, then 4 tabs for 2 days, then 3 tabs for 2 days,  2 tabs for 2 days, then 1 tab by mouth daily for 2 days (Patient not taking: Reported on 02/15/2021) 42 tablet 0   No facility-administered medications prior to visit.    Allergies  Allergen Reactions   Lexapro [Escitalopram] Anaphylaxis    Review of Systems All review of systems negative except what is listed in the HPI     Objective:    Physical Exam Constitutional:      Appearance: Normal appearance.  HENT:     Mouth/Throat:     Mouth: Mucous membranes are moist.     Pharynx: Oropharynx is clear. No oropharyngeal exudate or posterior oropharyngeal erythema.     Comments: Small (~67m), pink/flesh-colored papule-type area to hard palate, behind front teeth, no ulceration, erythema, discoloration, drainage Genitourinary:    Comments: deferred Musculoskeletal:     Cervical back:  Normal range of motion and neck supple.  Skin:    General: Skin is warm and dry.     Findings: No rash.  Neurological:     General: No focal deficit present.     Mental Status: He is alert and oriented to person, place, and time.  Psychiatric:        Mood and Affect: Mood normal.        Behavior: Behavior normal.        Thought Content: Thought content normal.        Judgment: Judgment normal.    BP (!) 146/91 (BP Location: Left Arm, Patient Position: Sitting, Cuff Size: Normal)    Pulse 73    Temp 98.1 F (36.7 C) (Oral)    Ht 6' (1.829 m)    Wt 210 lb 1.3 oz (95.3 kg)    SpO2 100%    BMI 28.49 kg/m  Wt Readings from Last 3 Encounters:  02/15/21 210 lb 1.3 oz (95.3 kg)  07/01/18 191 lb (86.6 kg)  06/22/17 187 lb (84.8 kg)    Health Maintenance Due  Topic Date Due   Pneumococcal Vaccine [age]Years old (1 - PCV) Never done   Hepatitis C Screening  Never done    There are no preventive care reminders to display for this patient.   No results found for: TSH Lab Results  Component Value Date   WBC 12.4 (H) 09/10/2020   HGB 13.3 09/10/2020   HCT 38.9 09/10/2020   MCV 90.7 09/10/2020   PLT 324 09/10/2020   Lab Results  Component Value Date   NA 142 09/10/2020   K 4.2 09/10/2020   CO2 24 09/10/2020   GLUCOSE 91 09/10/2020   BUN 14 09/10/2020   CREATININE 0.92 09/10/2020   BILITOT 0.3 09/10/2020   ALKPHOS 73 05/07/2017   AST 15 09/10/2020   ALT 15 09/10/2020   PROT 7.0 09/10/2020   ALBUMIN 3.8 05/07/2017   CALCIUM 9.6 09/10/2020   ANIONGAP 9 05/10/2017   EGFR 113 09/10/2020   No results found for: CHOL No results found for: HDL No results found for: LDLCALC No results found for: TRIG No results found for: CHOLHDL Lab Results  Component Value Date   HGBA1C 4.7 (L) 05/07/2017       Assessment & Plan:   1. Screening for STD (sexually transmitted disease) Will update patient with results as they come in.  - C. trachomatis/N. gonorrhoeae RNA - HIV  antibody (with reflex) - Hepatitis C Antibody - Hepatitis B Core Antibody, total - HSV(herpes simplex vrs) 1+2 ab-IgG - RPR  2. Oral lesion No drainage to swab. Blood  tests and GC oral swab as above. Will update him with results. In the meantime recommend saline rinses/gargles. Will send in magic mouthwash.  Patient aware of signs/symptoms requiring further/urgent evaluation.  Follow-up if symptoms worsen or fail to improve.   Purcell Nails Olevia Bowens, DNP, FNP-C

## 2021-02-17 LAB — GC/CHLAMYDIA PROBE, AMP (THROAT)
Chlamydia trachomatis RNA: NOT DETECTED
Neisseria gonorrhoeae RNA: DETECTED — AB

## 2021-02-19 ENCOUNTER — Other Ambulatory Visit: Payer: Self-pay | Admitting: Family Medicine

## 2021-02-19 ENCOUNTER — Other Ambulatory Visit: Payer: Self-pay

## 2021-02-19 ENCOUNTER — Ambulatory Visit (INDEPENDENT_AMBULATORY_CARE_PROVIDER_SITE_OTHER): Payer: BC Managed Care – PPO | Admitting: Family Medicine

## 2021-02-19 VITALS — BP 144/65 | HR 93

## 2021-02-19 DIAGNOSIS — A549 Gonococcal infection, unspecified: Secondary | ICD-10-CM

## 2021-02-19 DIAGNOSIS — K1370 Unspecified lesions of oral mucosa: Secondary | ICD-10-CM

## 2021-02-19 LAB — HIV ANTIBODY (ROUTINE TESTING W REFLEX): HIV 1&2 Ab, 4th Generation: NONREACTIVE

## 2021-02-19 LAB — HEPATITIS C ANTIBODY
Hepatitis C Ab: NONREACTIVE
SIGNAL TO CUT-OFF: 0.04 (ref <1.00)

## 2021-02-19 LAB — HEPATITIS B CORE ANTIBODY, TOTAL: Hep B Core Total Ab: NONREACTIVE

## 2021-02-19 LAB — RPR: RPR Ser Ql: NONREACTIVE

## 2021-02-19 LAB — HSV(HERPES SIMPLEX VRS) I + II AB-IGG
HAV 1 IGG,TYPE SPECIFIC AB: 18.6 index — ABNORMAL HIGH
HSV 2 IGG,TYPE SPECIFIC AB: <0.9 index

## 2021-02-19 MED ORDER — CEFTRIAXONE SODIUM 500 MG IJ SOLR
500.0000 mg | Freq: Once | INTRAMUSCULAR | Status: AC
  Administered 2021-02-19: 11:00:00 500 mg via INTRAMUSCULAR

## 2021-02-19 MED ORDER — MAGIC MOUTHWASH W/LIDOCAINE: 15.0000 mL | Freq: Four times a day (QID) | ORAL | 11 refills | Status: DC | PRN

## 2021-02-19 NOTE — Progress Notes (Signed)
Medical screening examination/treatment was performed by qualified clinical staff member and as supervising provider I was immediately available for consultation/collaboration. I have reviewed documentation and agree with assessment and plan. ? ?Svetlana Bagby B Chadrick Sprinkle, NP ? ?

## 2021-02-19 NOTE — Progress Notes (Signed)
Pt here for injection of Rocephin 500mg  for treatment of gonorrhea per .  Injection given in [facility].  Pt tolerated injection well.  Reminded to pt return for follow up testing.  Hyman Hopes, CMA

## 2021-02-20 LAB — HEPATITIS C ANTIBODY

## 2021-02-20 LAB — C. TRACHOMATIS/N. GONORRHOEAE RNA
C. trachomatis RNA, TMA: NOT DETECTED
N. gonorrhoeae RNA, TMA: NOT DETECTED

## 2021-02-20 LAB — HEPATITIS B CORE ANTIBODY, TOTAL

## 2021-02-20 LAB — HSV(HERPES SIMPLEX VRS) I + II AB-IGG

## 2021-02-20 LAB — RPR

## 2021-03-05 ENCOUNTER — Ambulatory Visit: Payer: BC Managed Care – PPO

## 2021-03-11 ENCOUNTER — Ambulatory Visit: Payer: BC Managed Care – PPO

## 2021-03-12 ENCOUNTER — Ambulatory Visit (INDEPENDENT_AMBULATORY_CARE_PROVIDER_SITE_OTHER): Payer: BC Managed Care – PPO | Admitting: Medical-Surgical

## 2021-03-12 ENCOUNTER — Other Ambulatory Visit: Payer: Self-pay

## 2021-03-12 DIAGNOSIS — K1370 Unspecified lesions of oral mucosa: Secondary | ICD-10-CM

## 2021-03-12 DIAGNOSIS — Z113 Encounter for screening for infections with a predominantly sexual mode of transmission: Secondary | ICD-10-CM | POA: Diagnosis not present

## 2021-03-12 DIAGNOSIS — A549 Gonococcal infection, unspecified: Secondary | ICD-10-CM

## 2021-03-12 NOTE — Progress Notes (Signed)
Lab only 

## 2021-03-12 NOTE — Progress Notes (Signed)
Test of cure swab completed during her's visit.  Pending result.  ___________________________________________ Thayer Ohm, DNP, APRN, FNP-BC Primary Care and Sports Medicine [facility] [place]

## 2021-03-13 LAB — GC/CHLAMYDIA PROBE, AMP (THROAT)
Chlamydia trachomatis RNA: NOT DETECTED
Neisseria gonorrhoeae RNA: NOT DETECTED

## 2021-05-15 ENCOUNTER — Ambulatory Visit: Payer: BC Managed Care – PPO | Admitting: Family Medicine

## 2021-06-27 ENCOUNTER — Ambulatory Visit: Payer: BC Managed Care – PPO | Admitting: Family Medicine
